# Patient Record
Sex: Female | Born: 1985
Health system: Southern US, Community
[De-identification: ages and names within clinical notes are randomized; demographics above are authoritative.]

## PROBLEM LIST (undated history)

## (undated) DIAGNOSIS — F419 Anxiety disorder, unspecified: Secondary | ICD-10-CM

## (undated) DIAGNOSIS — I10 Essential (primary) hypertension: Secondary | ICD-10-CM

## (undated) DIAGNOSIS — Z789 Other specified health status: Secondary | ICD-10-CM

## (undated) HISTORY — PX: CARPAL TUNNEL RELEASE: SHX101

---
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
## 2010-12-21 ENCOUNTER — Other Ambulatory Visit: Payer: Self-pay | Admitting: Women's Health

## 2010-12-21 ENCOUNTER — Other Ambulatory Visit (HOSPITAL_COMMUNITY)
Admission: RE | Admit: 2010-12-21 | Discharge: 2010-12-21 | Disposition: A | Discharge status: Home / Self Care | Payer: BC Managed Care – PPO | Source: Ambulatory Visit | Attending: Gynecology | Admitting: Gynecology

## 2010-12-21 ENCOUNTER — Ambulatory Visit (INDEPENDENT_AMBULATORY_CARE_PROVIDER_SITE_OTHER): Payer: BC Managed Care – PPO | Admitting: Women's Health

## 2010-12-21 DIAGNOSIS — Z01419 Encounter for gynecological examination (general) (routine) without abnormal findings: Secondary | ICD-10-CM

## 2010-12-21 DIAGNOSIS — Z124 Encounter for screening for malignant neoplasm of cervix: Secondary | ICD-10-CM | POA: Insufficient documentation

## 2010-12-21 DIAGNOSIS — R82998 Other abnormal findings in urine: Secondary | ICD-10-CM

## 2010-12-21 DIAGNOSIS — B373 Candidiasis of vulva and vagina: Secondary | ICD-10-CM

## 2010-12-21 DIAGNOSIS — B3731 Acute candidiasis of vulva and vagina: Secondary | ICD-10-CM

## 2010-12-21 DIAGNOSIS — Z833 Family history of diabetes mellitus: Secondary | ICD-10-CM

## 2011-03-07 ENCOUNTER — Telehealth: Payer: Self-pay | Admitting: *Deleted

## 2011-03-07 NOTE — Telephone Encounter (Signed)
Telephone call to patient regarding problem with birth control pills. When seen  for her annual exam her blood pressure was elevated 154/90 and 146/90. She had been on Yasmin we did stop that she was going to schedule Mirena IUD but states does not want to do that. She has been on Micronor now for 2 months and is having periods every 1-3 weeks and would like to try combination pill again. She states she has checked her blood pressure away from the office and that's been less than 130/80. Also has changed her diet as well as her exercise. Did review we could try the low Lo estrin and recheck her blood pressure after being on that. Will schedule an office visit, will continue on the Micronor until then. Switched to scheduling. She also states she has checked a home U PT which was negative.

## 2011-03-07 NOTE — Telephone Encounter (Signed)
Patient c/o issues with her birth control pills. Periods evey other week, and other side effects she want to discuss with you.

## 2011-03-09 ENCOUNTER — Ambulatory Visit (INDEPENDENT_AMBULATORY_CARE_PROVIDER_SITE_OTHER): Payer: BC Managed Care – PPO | Admitting: Women's Health

## 2011-03-09 ENCOUNTER — Encounter: Payer: Self-pay | Admitting: Women's Health

## 2011-03-09 VITALS — BP 120/70

## 2011-03-09 DIAGNOSIS — Z309 Encounter for contraceptive management, unspecified: Secondary | ICD-10-CM

## 2011-03-09 DIAGNOSIS — N946 Dysmenorrhea, unspecified: Secondary | ICD-10-CM

## 2011-03-09 DIAGNOSIS — IMO0001 Reserved for inherently not codable concepts without codable children: Secondary | ICD-10-CM

## 2011-03-09 MED ORDER — IBUPROFEN 600 MG PO TABS: 600.0000 mg | ORAL_TABLET | Freq: Three times a day (TID) | ORAL | Status: AC | PRN

## 2011-03-09 MED ORDER — NORETHIN-ETH ESTRAD-FE BIPHAS 1 MG-10 MCG / 10 MCG PO TABS: 1.0000 | ORAL_TABLET | Freq: Every day | ORAL | Status: DC

## 2011-03-09 NOTE — Progress Notes (Signed)
  Presents today  to discuss contraception. When she was here for her annual exam 6/12 her blood pressure was elevated 154/90, 146/90, she has also had some increased pressures in the past. Did review we could no longer prescribe Yasmin safely with an elevated blood pressure. She started on Micronor, has taken  with problems of spotting, irregular cycles, and some increased menstrual cramping. She has made some lifestyle changes she has lost about 10 pounds, she is eating a lower salt  and  carb diet and states is actually feeling better also.  BP today is 120/70. Did review this is normal she states she has had blood pressures less than 130/80 consistently when she has had checked at health services at [facility]. Will try  lo Loestrin 1 by mouth daily will start today, she will continue to monitor  her blood pressure . Prescription was given for 6 months and she will check her blood pressure - did review importance  it does elevate greater than 130/80 to return to the office. Reviewed slight risk for blood clots, strokes. She states she is aware the importance of blood pressure management, her mother is hypertensive. Continue to use condoms, which she does, same partner. She is a nonsmoker.  She also does have dysmenorrhea, will use Motrin 600 every 8 hours as needed . Prescription given.

## 2011-06-01 ENCOUNTER — Encounter: Payer: Self-pay | Admitting: Women's Health

## 2011-06-01 ENCOUNTER — Ambulatory Visit (INDEPENDENT_AMBULATORY_CARE_PROVIDER_SITE_OTHER): Payer: BC Managed Care – PPO | Admitting: Women's Health

## 2011-06-01 DIAGNOSIS — N926 Irregular menstruation, unspecified: Secondary | ICD-10-CM

## 2011-06-01 NOTE — Progress Notes (Signed)
Presents with a problem of irregular cycle on Lo Loestrin 24. States often will have 2 cycles in the month, had a regular monthly cycle on Yaz, but blood pressure was 150/90 annual exam while on Yaz.  Tried Micronor for several months, had headaches, spotting, increased acne and did not feel well. States blood pressure has been 130s/ 90 or lower on the lo Loestrin. Reviewed diastolic of 90s to high. Blood pressure today is 138/90 on no contraceptive pill. Has numerous family members with hypertension. States has increased exercise, but is having difficulty losing weight. Nonsmoker. Same partner.  Plan: Contraceptive management reviewed, reviewed Mirena IUD, Depo-Provera and Nexplanon. Declines, states would prefer Yaz. Will stay off pills, followup with primary care for blood pressure management. Will return to the office to restart Yaz after blood pressure is stable and less than 130/80. Reviewed risks of blood clots, strokes with elevated blood pressure and birth control pills. Will continue condoms, (always uses) plan B emergency contraception reviewed. Will also check a TSH and prolactin

## 2011-06-04 ENCOUNTER — Telehealth: Payer: Self-pay | Admitting: *Deleted

## 2011-06-04 DIAGNOSIS — I10 Essential (primary) hypertension: Secondary | ICD-10-CM

## 2011-06-04 NOTE — Telephone Encounter (Signed)
Please have a CMET added to the labs- thank you

## 2011-06-04 NOTE — Telephone Encounter (Signed)
Harlin Heys PA over at [facility] saw Meghan Mosley and started her on a 12.5 strength Hydrochlorothiazide.  Her B/P was 130/90 and will recheck her in 2 weeks.  Wants to know if we could add a CMET to the labs drawn from Friday visit? (Checked with the lab, they can order) OK to do?

## 2011-06-05 ENCOUNTER — Other Ambulatory Visit: Payer: Self-pay | Admitting: Gynecology

## 2011-06-05 DIAGNOSIS — I10 Essential (primary) hypertension: Secondary | ICD-10-CM

## 2011-06-05 LAB — COMPREHENSIVE METABOLIC PANEL
AST: 14 U/L (ref 0–37)
Albumin: 4.4 g/dL (ref 3.5–5.2)
Alkaline Phosphatase: 75 U/L (ref 39–117)
BUN: 12 mg/dL (ref 6–23)
Potassium: 4.8 mEq/L (ref 3.5–5.3)

## 2011-06-05 NOTE — Telephone Encounter (Signed)
Order in pc for lab to be run.

## 2011-11-04 ENCOUNTER — Ambulatory Visit (INDEPENDENT_AMBULATORY_CARE_PROVIDER_SITE_OTHER): Payer: BC Managed Care – PPO | Admitting: Family Medicine

## 2011-11-04 VITALS — BP 145/87 | HR 85 | Temp 94.4°F | Resp 18 | Ht 64.0 in | Wt 171.4 lb

## 2011-11-04 DIAGNOSIS — L0501 Pilonidal cyst with abscess: Secondary | ICD-10-CM

## 2011-11-04 DIAGNOSIS — L0591 Pilonidal cyst without abscess: Secondary | ICD-10-CM

## 2011-11-04 DIAGNOSIS — R030 Elevated blood-pressure reading, without diagnosis of hypertension: Secondary | ICD-10-CM

## 2011-11-04 MED ORDER — HYDROCODONE-ACETAMINOPHEN 5-325 MG PO TABS: 1.0000 | ORAL_TABLET | Freq: Four times a day (QID) | ORAL | Status: AC | PRN

## 2011-11-04 MED ORDER — DOXYCYCLINE HYCLATE 100 MG PO TABS: 100.0000 mg | ORAL_TABLET | Freq: Two times a day (BID) | ORAL | Status: AC

## 2011-11-04 NOTE — Progress Notes (Signed)
  Subjective:    Patient ID: Meghan Mosley, female    DOB: [birth year], 26 y.o.   MRN: [id number]  HPI Patient presents with 4 day history of tender bump on her tailbone.  Progressive pain. Denies drainage.  Denies fever or chills  PMH/ elevated BP readings that patient is caring for by behavioral interventions           Tdap 3 years ago  Non smoker  Review of Systems     Objective:   Physical Exam  Constitutional: She appears well-developed.  Cardiovascular: Normal rate.   Pulmonary/Chest: Effort normal and breath sounds normal.  Musculoskeletal:       Back:          Assessment & Plan:   1. Pilonidal abscess, S/P I+D  Wound culture, doxycycline (VIBRA-TABS) 100 MG tablet, HYDROcodone-acetaminophen (NORCO) 5-325 MG per tablet  2. Elevated BP      Anticipatory guidance Follow up 48 hours for packing change

## 2011-11-04 NOTE — Progress Notes (Signed)
VCO. Local anesthesia with1cc 2% plain lidocaine.  SPandD. Incision with 11 blade. Moderate purulence expressed. Wound flushed with 4 cc lidocaine. Wound packed with plain 1/4in. Gauze packing.  Cleansed and dressed.

## 2011-11-04 NOTE — Patient Instructions (Signed)
Pilonidal Cyst A pilonidal cyst occurs when hairs get trapped (ingrown) beneath the skin in the crease between the buttocks over your sacrum (the bone under that crease). Pilonidal cysts are most common in young men with a lot of body hair. When the cyst is ruptured (breaks) or leaking, fluid from the cyst may cause burning and itching. If the cyst becomes infected, it causes a painful swelling filled with pus (abscess). The pus and trapped hairs need to be removed (often by lancing) so that the infection can heal. However, recurrence is common and an operation may be needed to remove the cyst. HOME CARE INSTRUCTIONS   If the cyst was NOT INFECTED:   Keep the area clean and dry. Bathe or shower daily. Wash the area well with a germ-killing soap. Warm tub baths may help prevent infection and help with drainage. Dry the area well with a towel.   Avoid tight clothing to keep area as moisture free as possible.   Keep area between buttocks as free of hair as possible. A depilatory may be used.   If the cyst WAS INFECTED and needed to be drained:   Your caregiver packed the wound with gauze to keep the wound open. This allows the wound to heal from the inside outwards and continue draining.   Return for a wound check in 1 day or as suggested.   If you take tub baths or showers, repack the wound with gauze following them. Sponge baths (at the sink) are a good alternative.   If an antibiotic was ordered to fight the infection, take as directed.   Only take over-the-counter or prescription medicines for pain, discomfort, or fever as directed by your caregiver.   After the drain is removed, use sitz baths for 20 minutes 4 times per day. Clean the wound gently with mild unscented soap, pat dry, and then apply a dry dressing.  SEEK MEDICAL CARE IF:   You have increased pain, swelling, redness, drainage, or bleeding from the area.   You have a fever.   You have muscles aches, dizziness, or a  general ill feeling.  Document Released: 06/29/2000 Document Revised: 06/21/2011 Document Reviewed: 08/27/2008 Exit[employer] Patient Information 2012 [employer]. 

## 2011-11-06 ENCOUNTER — Ambulatory Visit (INDEPENDENT_AMBULATORY_CARE_PROVIDER_SITE_OTHER): Payer: BC Managed Care – PPO | Admitting: Physician Assistant

## 2011-11-06 ENCOUNTER — Encounter: Payer: Self-pay | Admitting: Physician Assistant

## 2011-11-06 VITALS — BP 144/86 | HR 81 | Temp 98.5°F | Resp 16 | Ht 62.75 in | Wt 176.4 lb

## 2011-11-06 DIAGNOSIS — L0591 Pilonidal cyst without abscess: Secondary | ICD-10-CM

## 2011-11-06 DIAGNOSIS — L0501 Pilonidal cyst with abscess: Secondary | ICD-10-CM

## 2011-11-06 NOTE — Progress Notes (Signed)
   Patient ID: Danaysia Rader MRN: [id number], DOB: [birth year] 26 y.o. Date of Encounter: 11/06/2011, 3:26 PM  Primary Physician: No primary provider on file.  Chief Complaint: Wound care   See previous note  HPI: 26 y.o. y/o female presents for wound care s/p I&D on 11/04/11. Doing well No issues or complaints Afebrile/ no chills No nausea or vomiting Tolerating Doxycycline Pain improved. Pain currently 1-2/10 without requiring medication Daily dressing change Previous note reviewed  No past medical history on file.   Home Meds: Prior to Admission medications   Medication Sig Start Date End Date Taking? Authorizing Provider  doxycycline (VIBRA-TABS) 100 MG tablet Take 1 tablet (100 mg total) by mouth 2 (two) times daily. 11/04/11 11/14/11 Yes Dois Davenport, MD  HYDROcodone-acetaminophen (NORCO) 5-325 MG per tablet Take 1 tablet by mouth every 6 (six) hours as needed for pain. 11/04/11 11/14/11 Yes Dois Davenport, MD  Multiple Vitamin (MULTIVITAMIN) capsule Take 1 capsule by mouth daily.     Yes Historical Provider, MD  Norethindrone-Ethinyl Estradiol-Fe Biphas (LO LOESTRIN FE) 1 MG-10 MCG / 10 MCG tablet Take 1 tablet by mouth daily. 03/09/11 03/08/12  Harrington Challenger, NP    Allergies: No Known Allergies  ROS: Constitutional: Afebrile, no chills Cardiovascular: negative for chest pain or palpitations Dermatological: Positive for wound. Negative for warmth.  GI: No nausea or vomiting   EXAM: Physical Exam: Blood pressure 144/86, pulse 81, temperature 98.5 F (36.9 C), temperature source Oral, resp. rate 16, height 5' 2.75" (1.594 m), weight 176 lb 6.4 oz (80.015 kg)., Body mass index is 31.50 kg/(m^2). General: Well developed, well nourished, in no acute distress. Nontoxic appearing. Head: Normocephalic, atraumatic, sclera non-icteric.  Neck: Supple. Lungs: Breathing is unlabored. Heart: Normal rate. Skin:  Warm and moist. Dressing and packing in place. Mild local induration,  erythema, and tenderness to palpation. Neuro: Alert and oriented X 3. Moves all extremities spontaneously. Normal gait.  Psych:  Responds to questions appropriately with a normal affect.   PROCEDURE: Dressing and packing removed. Scant purulence expressed Wound bed healthy Irrigated with 1% plain lidocaine 5 cc. Repacked with 1/4 inch plain packing Dressing applied  LAB: Culture: Preliminary results moderate strep species   A/P: 26 y.o. y/o female with cellulitis/abscess as above s/p I&D on 11/04/11 -Wound care per above -Continue Doxycycline -Pain well controlled -Daily dressing changes -Recheck 48 hours  Signed, Eula Listen, PA-C 11/06/2011 3:26 PM

## 2011-11-07 LAB — WOUND CULTURE: Gram Stain: NONE SEEN

## 2011-11-08 ENCOUNTER — Ambulatory Visit: Payer: BC Managed Care – PPO | Admitting: Internal Medicine

## 2011-11-08 ENCOUNTER — Encounter: Payer: Self-pay | Admitting: Family Medicine

## 2011-11-08 VITALS — BP 133/80 | HR 84 | Temp 98.5°F | Resp 16 | Ht 62.0 in | Wt 175.0 lb

## 2011-11-08 DIAGNOSIS — L0501 Pilonidal cyst with abscess: Secondary | ICD-10-CM

## 2011-11-08 NOTE — Progress Notes (Signed)
  Subjective:    Patient ID: Meghan Mosley, female    DOB: [birth year], 26 y.o.   MRN: [id number]  HPI  Meghan Mosley is here for wound care of her pilonidal cyst.  See previous notes.  She is taking her doxycycline without difficulty.  No fever, chills. Not taking pain medication any longer.  Complains of itching over the wound.    Review of Systems  All other systems reviewed and are negative.  negative except as noted in HPI     Objective:   Physical Exam  Vitals reviewed. Psychiatric: She has a normal mood and affect. Her behavior is normal.   Procedure Note:  VCO.  Dressing and packing removed which showed bloody drainage.  5cc 1% lidocaine used to irrigate wound, no purulence expressed.  Wound bed red, healthy appearing.  Repacked with 1/4 inch packing, telfa, gauze, hypofix.       Assessment & Plan:  Pilonidal abcess improving:  RTC in 2-3 days, fast pass given.  Wound care reviewed.  Culture showed streptococci, will have her finish doxycycline as healing well.

## 2011-11-10 ENCOUNTER — Ambulatory Visit (INDEPENDENT_AMBULATORY_CARE_PROVIDER_SITE_OTHER): Payer: BC Managed Care – PPO | Admitting: Physician Assistant

## 2011-11-10 VITALS — BP 130/79 | HR 87 | Temp 98.5°F | Resp 16 | Ht 63.25 in | Wt 176.0 lb

## 2011-11-10 DIAGNOSIS — L0501 Pilonidal cyst with abscess: Secondary | ICD-10-CM

## 2011-11-10 NOTE — Progress Notes (Signed)
   Patient ID: Petrice Beedy MRN: [id number], DOB: 1985/11/29 26 y.o. Date of Encounter: 11/10/2011, 12:09 PM  Primary Physician: Tally Due, MD, MD  Chief Complaint: Wound care   See previous note  HPI: 26 y.o. y/o female presents for wound care s/p I&D on 11/04/11. Doing well No issues or complaints Afebrile/ no chills No nausea or vomiting Tolerating Doxycycline Pain resolved Packing came out the previous day Daily dressing change Previous note reviewed  No past medical history on file.   Home Meds: Prior to Admission medications   Medication Sig Start Date End Date Taking? Authorizing Provider  doxycycline (VIBRA-TABS) 100 MG tablet Take 1 tablet (100 mg total) by mouth 2 (two) times daily. 11/04/11 11/14/11 Yes Dois Davenport, MD  Multiple Vitamin (MULTIVITAMIN) capsule Take 1 capsule by mouth daily.     Yes Historical Provider, MD  HYDROcodone-acetaminophen (NORCO) 5-325 MG per tablet Take 1 tablet by mouth every 6 (six) hours as needed for pain. 11/04/11 11/14/11  Dois Davenport, MD    Allergies: No Known Allergies  ROS: Constitutional: Afebrile, no chills Cardiovascular: negative for chest pain or palpitations Dermatological: Positive for wound. Negative for erythema, pain, or warmth  GI: No nausea or vomiting   EXAM: Physical Exam: Blood pressure 130/79, pulse 87, temperature 98.5 F (36.9 C), temperature source Oral, resp. rate 16, height 5' 3.25" (1.607 m), weight 176 lb (79.833 kg), last menstrual period 11/04/2011., Body mass index is 30.93 kg/(m^2). General: Well developed, well nourished, in no acute distress. Nontoxic appearing. Head: Normocephalic, atraumatic, sclera non-icteric.  Neck: Supple. Lungs: Breathing is unlabored. Heart: Normal rate. Skin:  Warm and moist. Dressing and packing in place. No induration, erythema, or tenderness to palpation. Neuro: Alert and oriented X 3. Moves all extremities spontaneously. Normal gait.  Psych:  Responds  to questions appropriately with a normal affect.   PROCEDURE: Dressing and packing removed. No purulence expressed Wound bed healthy and healed up the skin surface Irrigated with 1% plain lidocaine 5 cc. No packing required Dressing applied  LAB: Culture: strep  A/P: 26 y.o. y/o female with resolved cellulitis/abscess as above s/p I&D on 11/04/11 -Wound care per above -Resolved -Continue Doxycyline until complete -No pain -Daily dressing changes  Signed, Eula Listen, PA-C 11/10/2011 12:09 PM

## 2012-01-01 ENCOUNTER — Ambulatory Visit (INDEPENDENT_AMBULATORY_CARE_PROVIDER_SITE_OTHER): Payer: BC Managed Care – PPO | Admitting: Internal Medicine

## 2012-01-01 ENCOUNTER — Ambulatory Visit: Payer: BC Managed Care – PPO

## 2012-01-01 VITALS — BP 148/81 | HR 73 | Temp 97.7°F | Resp 16 | Ht 64.0 in | Wt 177.0 lb

## 2012-01-01 DIAGNOSIS — R209 Unspecified disturbances of skin sensation: Secondary | ICD-10-CM

## 2012-01-01 DIAGNOSIS — N926 Irregular menstruation, unspecified: Secondary | ICD-10-CM

## 2012-01-01 DIAGNOSIS — M542 Cervicalgia: Secondary | ICD-10-CM

## 2012-01-01 DIAGNOSIS — R202 Paresthesia of skin: Secondary | ICD-10-CM

## 2012-01-01 DIAGNOSIS — N912 Amenorrhea, unspecified: Secondary | ICD-10-CM

## 2012-01-01 DIAGNOSIS — E282 Polycystic ovarian syndrome: Secondary | ICD-10-CM

## 2012-01-01 LAB — COMPREHENSIVE METABOLIC PANEL
AST: 19 U/L (ref 0–37)
Alkaline Phosphatase: 88 U/L (ref 39–117)
BUN: 12 mg/dL (ref 6–23)
Calcium: 9.6 mg/dL (ref 8.4–10.5)
Creat: 0.76 mg/dL (ref 0.50–1.10)

## 2012-01-01 LAB — POCT CBC
Lymph, poc: 2 (ref 0.6–3.4)
MCH, POC: 27.6 pg (ref 27–31.2)
MCHC: 31.9 g/dL (ref 31.8–35.4)
MID (cbc): 0.4 (ref 0–0.9)
MPV: 10.1 fL (ref 0–99.8)
POC MID %: 5.8 %M (ref 0–12)
Platelet Count, POC: 285 10*3/uL (ref 142–424)
WBC: 6.7 10*3/uL (ref 4.6–10.2)

## 2012-01-01 MED ORDER — DROSPIRENONE-ETHINYL ESTRADIOL 3-0.02 MG PO TABS: 1.0000 | ORAL_TABLET | Freq: Every day | ORAL | Status: DC

## 2012-01-01 NOTE — Progress Notes (Signed)
Subjective:    Patient ID: Meghan Mosley, female    DOB: 1986-05-01, 26 y.o.   MRN: [id number]  HPIProblem #1 right hand, specifically the thumb index and middle fingers will go while playing the clarinet or while holding things, while text messaging, and she has to shake her hand for it to resolve. Getting worse over 6 months. Occasionally wakes at night with hand numb. This occasionally happens in the left hand as well but it's not as severe. It doesn't happen as much playing saxophone but does happen when she brushes her teeth holding electric toothbrush. No weakness. Hand is not cold or pale. She has occasional stiffness and tenderness in the left cervical area but no history of prior neck problems. 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  Past medical history-PCOS-usually controlled with oral contraceptives which she also needs her birth control but her gynecologist stopped these in October 2012 because her blood pressure was elevated. She has a family history of hypertension by her home blood pressures are always in the range of 120 systolic. She is only had 3 periods since discontinuing the oral contraceptives and wants a pregnancy test today. Off of birth control pills she has resumed with hirsutism as well.  Review of Systems  Constitutional: Negative for fever, activity change, appetite change, fatigue and unexpected weight change.  Eyes: Negative.   Respiratory: Negative.   Cardiovascular: Negative.   Gastrointestinal: Negative.   Genitourinary: Negative.   Neurological: Negative for dizziness and headaches.       Objective:   Physical Exam Blood pressure 148/81 Weight 277 pounds HEENT clear including no thyromegaly Neck has good range of motion with mild tenderness on extension She is tender in the right paracervical muscles and in the left trapezius The right shoulder has a full range of motion without pain There are no  sensory or motor losses in the right arm or hand and grip is full Radial pulses full and does not vary with position of the arm at any degree of elevation There no supraclavicular bruits/no supraclavicular nodes The left hand and shoulder are normal to exam Tinel's is negative bilaterally Extension of the wrist does not recreate numbness over 30-45 seconds        Results for orders placed in visit on 01/01/12  POCT URINE PREGNANCY      Component Value Range   Preg Test, Ur Negative    POCT CBC      Component Value Range   WBC 6.7  4.6 - 10.2 K/uL   Lymph, poc 2.0  0.6 - 3.4   POC LYMPH PERCENT 30.4  10 - 50 %L   MID (cbc) 0.4  0 - 0.9   POC MID % 5.8  0 - 12 %M   POC Granulocyte 4.3  2 - 6.9   Granulocyte percent 63.8  37 - 80 %G   RBC 5.15  4.04 - 5.48 M/uL   Hemoglobin 14.2  12.2 - 16.2 g/dL   HCT, POC 40.9  81.1 - 47.9 %   MCV 86.5  80 - 97 fL   MCH, POC 27.6  27 - 31.2 pg   MCHC 31.9  31.8 - 35.4 g/dL   RDW, POC 91.4     Platelet Count, POC 285  142 - 424 K/uL   MPV 10.1  0 - 99.8 fL   [facility] reading (PRIMARY) by  Dr.Keilen Kahl=Loss of normal lordosis but otherwise C-spine is within  normal limits    Assessment & Plan:  Problem #1 bilateral hand paresthesias with right greater than left Problem #2 PCO S. With amenorrhea  problem #3Neck pain Problem #4 elevated blood pressure without diagnosis of hypertension Problem #5 overweight  Check metabolic profile and TSH She will will need neurological consultation, MRI of the C-spine, or peripheral nerve conduction velocities and we will decide this after her lab work I recommend restarting Yaz and then following home bp

## 2012-01-05 NOTE — Addendum Note (Signed)
Addended by: Johnnette Litter on: 01/05/2012 12:47 PM   Modules accepted: Orders

## 2012-01-24 ENCOUNTER — Other Ambulatory Visit: Payer: Self-pay

## 2012-01-24 DIAGNOSIS — E282 Polycystic ovarian syndrome: Secondary | ICD-10-CM

## 2012-06-30 ENCOUNTER — Ambulatory Visit (INDEPENDENT_AMBULATORY_CARE_PROVIDER_SITE_OTHER): Payer: BC Managed Care – PPO | Admitting: Internal Medicine

## 2012-06-30 VITALS — BP 132/80 | HR 83 | Temp 98.1°F | Resp 18 | Ht 63.5 in | Wt 179.6 lb

## 2012-06-30 DIAGNOSIS — M549 Dorsalgia, unspecified: Secondary | ICD-10-CM

## 2012-06-30 DIAGNOSIS — S335XXA Sprain of ligaments of lumbar spine, initial encounter: Secondary | ICD-10-CM

## 2012-06-30 MED ORDER — MELOXICAM 15 MG PO TABS: 15.0000 mg | ORAL_TABLET | Freq: Every day | ORAL | Status: DC

## 2012-06-30 MED ORDER — CYCLOBENZAPRINE HCL 10 MG PO TABS: 10.0000 mg | ORAL_TABLET | Freq: Every day | ORAL | Status: DC

## 2012-06-30 NOTE — Progress Notes (Signed)
  Subjective:    Patient ID: Meghan Mosley, female    DOB: 1986-03-13, 26 y.o.   MRN: [id number]  HPI3d ago twisting and lifting jerk at work Pain ever since in low back--can't bend No radicular pain to the legs No weakness in the legs/no paresthesias No bowel or bladder changes No past history of back problems Works serving at [employer]    Review of Systems No intercurrent illness complaints    Objective:   Physical Exam Vital signs stable except obesity Spine is straight There's tenderness to palpation from the left lower rib margin posteriorly to the iliac crest along the entire left lumbosacral area/straight leg raise bilaterally to 90 creates discomfort along the left side without radicular symptoms Twisting and flexion increased pain Deep tendon reflexes are symmetrical There is no peripheral numbness or weakness Hip range of motion is good       Assessment & Plan:  Problem #1 acute lumbosacral strain-left  Meds ordered this encounter  Medications  . cyclobenzaprine (FLEXERIL) 10 MG tablet    Sig: Take 1 tablet (10 mg total) by mouth at bedtime.    Dispense:  30 tablet    Refill:  0  . meloxicam (MOBIC) 15 MG tablet    Sig: Take 1 tablet (15 mg total) by mouth daily.    Dispense:  30 tablet    Refill:  0   Back exercises given Use heat Restrict work 7-10 days Followup 7-10 days if not well

## 2012-06-30 NOTE — Patient Instructions (Addendum)
Back Exercises Back exercises help treat and prevent back injuries. The goal is to increase your strength in your belly (abdominal) and back muscles. These exercises can also help with flexibility. Start these exercises when told by your doctor. HOME CARE Back exercises include: Pelvic Tilt.  Lie on your back with your knees bent. Tilt your pelvis until the lower part of your back is against the floor. Hold this position 5 to 10 sec. Repeat this exercise 5 to 10 times. Knee to Chest.  Pull 1 knee up against your chest and hold for 20 to 30 seconds. Repeat this with the other knee. This may be done with the other leg straight or bent, whichever feels better. Then, pull both knees up against your chest. Sit-Ups or Curl-Ups.  Bend your knees 90 degrees. Start with tilting your pelvis, and do a partial, slow sit-up. Only lift your upper half 30 to 45 degrees off the floor. Take at least 2 to 3 seonds for each sit-up. Do not do sit-ups with your knees out straight. If partial sit-ups are difficult, simply do the above but with only tightening your belly (abdominal) muscles and holding it as told. Hip-Lift.  Lie on your back with your knees flexed 90 degrees. Push down with your feet and shoulders as you raise your hips 2 inches off the floor. Hold for 10 seconds, repeat 5 to 10 times. Back Arches.  Lie on your stomach. Prop yourself up on bent elbows. Slowly press on your hands, causing an arch in your low back. Repeat 3 to 5 times. Shoulder-Lifts.  Lie face down with arms beside your body. Keep hips and belly pressed to floor as you slowly lift your head and shoulders off the floor. Do not overdo your exercises. Be careful in the beginning. Exercises may cause you some mild back discomfort. If the pain lasts for more than 15 minutes, stop the exercises until you see your doctor. Improvement with exercise for back problems is slow.  Document Released: 08/04/2010 Document Revised: 09/24/2011  Document Reviewed: 05/03/2011 Exit[employer] Patient Information 2013 [employer].  

## 2012-09-18 ENCOUNTER — Ambulatory Visit (INDEPENDENT_AMBULATORY_CARE_PROVIDER_SITE_OTHER): Payer: BC Managed Care – PPO | Admitting: Emergency Medicine

## 2012-09-18 VITALS — BP 132/76 | HR 91 | Temp 98.0°F | Resp 17 | Ht 63.5 in | Wt 180.0 lb

## 2012-09-18 DIAGNOSIS — H9209 Otalgia, unspecified ear: Secondary | ICD-10-CM

## 2012-09-18 DIAGNOSIS — J020 Streptococcal pharyngitis: Secondary | ICD-10-CM

## 2012-09-18 DIAGNOSIS — J329 Chronic sinusitis, unspecified: Secondary | ICD-10-CM

## 2012-09-18 LAB — POCT RAPID STREP A (OFFICE): Rapid Strep A Screen: NEGATIVE

## 2012-09-18 MED ORDER — FLUTICASONE PROPIONATE 50 MCG/ACT NA SUSP: 2.0000 | Freq: Every day | NASAL | Status: DC

## 2012-09-18 MED ORDER — AMOXICILLIN 875 MG PO TABS: 875.0000 mg | ORAL_TABLET | Freq: Two times a day (BID) | ORAL | Status: DC

## 2012-09-18 NOTE — Progress Notes (Signed)
  Subjective:    Patient ID: Meghan Mosley, female    DOB: Jan 29, 1986, 27 y.o.   MRN: [id number]  HPI Pt presents with sore throat and cough that started last Tuesday. It progressed with left ear pain and nasal congestion.  She has been taking otc meds but its not helping at all. Throat is now swollen and it is hard to swallow. No aches or fever.  She has been taking Catering manager and Mucinex.     Review of Systems patient is on birth control pills     Objective:   Physical Exam injured clear. Nose is congested. Throat is red on the left. There are no anterior cervical nodes. Chest is clear to auscultation and percussion  Results for orders placed in visit on 09/18/12  POCT RAPID STREP A (OFFICE)      Result Value Range   Rapid Strep A Screen Negative  Negative        Assessment & Plan:  Patient here with a sinusitis. We'll treat with amoxicillin and Flonase. Strep test done was negative

## 2013-01-23 ENCOUNTER — Other Ambulatory Visit: Payer: Self-pay | Admitting: Internal Medicine

## 2013-05-18 ENCOUNTER — Ambulatory Visit (INDEPENDENT_AMBULATORY_CARE_PROVIDER_SITE_OTHER): Payer: BC Managed Care – PPO | Admitting: Physician Assistant

## 2013-05-18 VITALS — BP 130/80 | HR 70 | Temp 98.7°F | Resp 18 | Wt 185.0 lb

## 2013-05-18 DIAGNOSIS — H8110 Benign paroxysmal vertigo, unspecified ear: Secondary | ICD-10-CM

## 2013-05-18 DIAGNOSIS — Z3009 Encounter for other general counseling and advice on contraception: Secondary | ICD-10-CM

## 2013-05-18 DIAGNOSIS — Z30011 Encounter for initial prescription of contraceptive pills: Secondary | ICD-10-CM

## 2013-05-18 MED ORDER — MECLIZINE HCL 25 MG PO TABS: 25.0000 mg | ORAL_TABLET | Freq: Three times a day (TID) | ORAL | Status: DC | PRN

## 2013-05-18 MED ORDER — DROSPIRENONE-ETHINYL ESTRADIOL 3-0.02 MG PO TABS: 1.0000 | ORAL_TABLET | Freq: Every day | ORAL | Status: DC

## 2013-05-18 NOTE — Progress Notes (Signed)
   [address] [place] [address]   Phone [phone]  Subjective:    Patient ID: Meghan Mosley, female    DOB: May 02, 1986, 27 y.o.   MRN: [id number]  HPI Pt presents to clinic with vertigo whenever she turns her head to the right and looks up.  She has similar sensation when she goes from sitting to laying.  When she is laying completely still she has no symptoms.  It started yesterday and she worked through her symptoms.  Today she had off and she thought resting would make it better but it has not.  She has had a headache today but she has had no caffeine and this feels like one of those headaches.  She has had no recent colds.  She is not weak.  She is having no vision changes.  Review of Systems  Constitutional: Negative for fever and chills.  HENT: Negative for congestion, ear discharge, ear pain and hearing loss.   Gastrointestinal: Negative for nausea.  Neurological: Positive for dizziness and headaches. Negative for weakness.       Objective:   Physical Exam  Vitals reviewed. Constitutional: She is oriented to person, place, and time. She appears well-developed and well-nourished.  HENT:  Head: Normocephalic and atraumatic.  Right Ear: Hearing, external ear and ear canal normal. A middle ear effusion is present.  Left Ear: Hearing, external ear and ear canal normal. A middle ear effusion is present.  Nose: Nose normal.  Mouth/Throat: Oropharynx is clear and moist.  Eyes: Conjunctivae and lids are normal. Pupils are equal, round, and reactive to light. Right eye exhibits nystagmus (horizontal). Left eye exhibits nystagmus (horizontal). Right pupil is round and reactive. Left pupil is round and reactive. Pupils are equal.  Cardiovascular: Normal rate, regular rhythm and normal heart sounds.   No murmur heard. Pulmonary/Chest: Effort normal and breath sounds normal.  Neurological: She is alert and oriented to person, place, and time.  Skin: Skin is warm and dry.    Psychiatric: She has a normal mood and affect. Her behavior is normal. Judgment and thought content normal.   Epley maneuvers performed and patients symptoms resolved before she left the office.     Assessment & Plan:  BCP (birth control pills) initiation -Pt ran out of her birth control about 6 months ago and has had trouble getting it refilled. Will refill and she will make an appt for a CPE.  Plan: drospirenone-ethinyl estradiol (LORYNA) 3-0.02 MG tablet  BPV (benign positional vertigo) - Sent to the pharmacy incase her symptoms return but do not expect them to.  She will be careful with quick movements over the next little bit.  Plan: meclizine (ANTIVERT) 25 MG tablet  Warda Mcqueary [facility] 05/18/2013 7:12 PM

## 2013-05-18 NOTE — Patient Instructions (Signed)
Epley maneuver - what you can do to make the stone drop out of place.  Benign Positional Vertigo Vertigo means you feel like you or your surroundings are moving when they are not. Benign positional vertigo is the most common form of vertigo. Benign means that the cause of your condition is not serious. Benign positional vertigo is more common in older adults. CAUSES  Benign positional vertigo is the result of an upset in the labyrinth system. This is an area in the middle ear that helps control your balance. This may be caused by a viral infection, head injury, or repetitive motion. However, often no specific cause is found. SYMPTOMS  Symptoms of benign positional vertigo occur when you move your head or eyes in different directions. Some of the symptoms may include:  Loss of balance and falls.  Vomiting.  Blurred vision.  Dizziness.  Nausea.  Involuntary eye movements (nystagmus). DIAGNOSIS  Benign positional vertigo is usually diagnosed by physical exam. If the specific cause of your benign positional vertigo is unknown, your caregiver may perform imaging tests, such as magnetic resonance imaging (MRI) or computed tomography (CT). TREATMENT  Your caregiver may recommend movements or procedures to correct the benign positional vertigo. Medicines such as meclizine, benzodiazepines, and medicines for nausea may be used to treat your symptoms. In rare cases, if your symptoms are caused by certain conditions that affect the inner ear, you may need surgery. HOME CARE INSTRUCTIONS   Follow your caregiver's instructions.  Move slowly. Do not make sudden body or head movements.  Avoid driving.  Avoid operating heavy machinery.  Avoid performing any tasks that would be dangerous to you or others during a vertigo episode.  Drink enough fluids to keep your urine clear or pale yellow. SEEK IMMEDIATE MEDICAL CARE IF:   You develop problems with walking, weakness, numbness, or using your  arms, hands, or legs.  You have difficulty speaking.  You develop severe headaches.  Your nausea or vomiting continues or gets worse.  You develop visual changes.  Your family or friends notice any behavioral changes.  Your condition gets worse.  You have a fever.  You develop a stiff neck or sensitivity to light. MAKE SURE YOU:   Understand these instructions.  Will watch your condition.  Will get help right away if you are not doing well or get worse. Document Released: 04/09/2006 Document Revised: 09/24/2011 Document Reviewed: 03/22/2011 [facility] Patient Information 2014 [place], [place].

## 2013-05-22 NOTE — Progress Notes (Signed)
Left message for patient regarding scheduling.

## 2013-06-05 NOTE — Progress Notes (Signed)
Left message to schedule appointment

## 2013-09-29 ENCOUNTER — Encounter (HOSPITAL_COMMUNITY): Payer: Self-pay | Admitting: Emergency Medicine

## 2013-09-29 ENCOUNTER — Emergency Department (HOSPITAL_COMMUNITY)
Admission: EM | Admit: 2013-09-29 | Discharge: 2013-09-29 | Disposition: A | Discharge status: Home / Self Care | Payer: BC Managed Care – PPO | Attending: Emergency Medicine | Admitting: Emergency Medicine

## 2013-09-29 DIAGNOSIS — R197 Diarrhea, unspecified: Secondary | ICD-10-CM | POA: Insufficient documentation

## 2013-09-29 DIAGNOSIS — R6883 Chills (without fever): Secondary | ICD-10-CM | POA: Insufficient documentation

## 2013-09-29 DIAGNOSIS — Z79899 Other long term (current) drug therapy: Secondary | ICD-10-CM | POA: Insufficient documentation

## 2013-09-29 DIAGNOSIS — Z3202 Encounter for pregnancy test, result negative: Secondary | ICD-10-CM | POA: Insufficient documentation

## 2013-09-29 DIAGNOSIS — R1013 Epigastric pain: Secondary | ICD-10-CM | POA: Insufficient documentation

## 2013-09-29 DIAGNOSIS — R112 Nausea with vomiting, unspecified: Secondary | ICD-10-CM

## 2013-09-29 LAB — COMPREHENSIVE METABOLIC PANEL
ALK PHOS: 85 U/L (ref 39–117)
ALT: 12 U/L (ref 0–35)
AST: 17 U/L (ref 0–37)
Albumin: 4.5 g/dL (ref 3.5–5.2)
BILIRUBIN TOTAL: 0.4 mg/dL (ref 0.3–1.2)
BUN: 15 mg/dL (ref 6–23)
CHLORIDE: 96 meq/L (ref 96–112)
CO2: 18 meq/L — AB (ref 19–32)
Calcium: 10.5 mg/dL (ref 8.4–10.5)
Creatinine, Ser: 0.92 mg/dL (ref 0.50–1.10)
GFR calc Af Amer: >90 mL/min (ref >90)
GFR calc non Af Amer: 86 mL/min — ABNORMAL LOW (ref >90)
Glucose, Bld: 172 mg/dL — ABNORMAL HIGH (ref 70–99)
Potassium: 4.3 mEq/L (ref 3.7–5.3)
Sodium: 139 mEq/L (ref 137–147)
Total Protein: 8.8 g/dL — ABNORMAL HIGH (ref 6.0–8.3)

## 2013-09-29 LAB — CBC WITH DIFFERENTIAL/PLATELET
BASOS ABS: 0 10*3/uL (ref 0.0–0.1)
Basophils Relative: 0 % (ref 0–1)
Eosinophils Absolute: 0 10*3/uL (ref 0.0–0.7)
Eosinophils Relative: 0 % (ref 0–5)
HEMATOCRIT: 47.1 % — AB (ref 36.0–46.0)
Hemoglobin: 15.6 g/dL — ABNORMAL HIGH (ref 12.0–15.0)
LYMPHS PCT: 3 % — AB (ref 12–46)
Lymphs Abs: 0.5 10*3/uL — ABNORMAL LOW (ref 0.7–4.0)
MCH: 28.3 pg (ref 26.0–34.0)
MCHC: 33.1 g/dL (ref 30.0–36.0)
MCV: 85.3 fL (ref 78.0–100.0)
MONOS PCT: 7 % (ref 3–12)
Monocytes Absolute: 1.3 10*3/uL — ABNORMAL HIGH (ref 0.1–1.0)
Neutro Abs: 17.6 10*3/uL — ABNORMAL HIGH (ref 1.7–7.7)
Neutrophils Relative %: 91 % — ABNORMAL HIGH (ref 43–77)
Platelets: 311 10*3/uL (ref 150–400)
RBC: 5.52 MIL/uL — ABNORMAL HIGH (ref 3.87–5.11)
RDW: 13.2 % (ref 11.5–15.5)
WBC: 19.3 10*3/uL — ABNORMAL HIGH (ref 4.0–10.5)

## 2013-09-29 LAB — URINE MICROSCOPIC-ADD ON

## 2013-09-29 LAB — URINALYSIS, ROUTINE W REFLEX MICROSCOPIC
GLUCOSE, UA: NEGATIVE mg/dL
KETONES UR: NEGATIVE mg/dL
Nitrite: NEGATIVE
PROTEIN: 100 mg/dL — AB
Specific Gravity, Urine: 1.038 — ABNORMAL HIGH (ref 1.005–1.030)
Urobilinogen, UA: 0.2 mg/dL (ref 0.0–1.0)
pH: 6 (ref 5.0–8.0)

## 2013-09-29 LAB — LIPASE, BLOOD: Lipase: 26 U/L (ref 11–59)

## 2013-09-29 LAB — LACTIC ACID, PLASMA: Lactic Acid, Venous: 4.4 mmol/L — ABNORMAL HIGH (ref 0.5–2.2)

## 2013-09-29 LAB — PREGNANCY, URINE: PREG TEST UR: NEGATIVE

## 2013-09-29 LAB — I-STAT CG4 LACTIC ACID, ED: LACTIC ACID, VENOUS: 3.85 mmol/L — AB (ref 0.5–2.2)

## 2013-09-29 LAB — CK: CK TOTAL: 89 U/L (ref 7–177)

## 2013-09-29 MED ORDER — MORPHINE SULFATE 4 MG/ML IJ SOLN
4.0000 mg | Freq: Once | INTRAMUSCULAR | Status: AC
  Administered 2013-09-29: 4 mg via INTRAVENOUS
  Filled 2013-09-29: qty 1

## 2013-09-29 MED ORDER — SODIUM CHLORIDE 0.9 % IV SOLN
1000.0000 mL | Freq: Once | INTRAVENOUS | Status: AC
  Administered 2013-09-29: 1000 mL via INTRAVENOUS

## 2013-09-29 MED ORDER — SODIUM CHLORIDE 0.9 % IV BOLUS (SEPSIS)
1000.0000 mL | Freq: Once | INTRAVENOUS | Status: AC
  Administered 2013-09-29: 1000 mL via INTRAVENOUS

## 2013-09-29 MED ORDER — ONDANSETRON 4 MG PO TBDP: 4.0000 mg | ORAL_TABLET | Freq: Three times a day (TID) | ORAL | Status: DC | PRN

## 2013-09-29 MED ORDER — ONDANSETRON HCL 4 MG/2ML IJ SOLN
4.0000 mg | Freq: Once | INTRAMUSCULAR | Status: AC
  Administered 2013-09-29: 4 mg via INTRAVENOUS
  Filled 2013-09-29: qty 2

## 2013-09-29 MED ORDER — FENTANYL CITRATE 0.05 MG/ML IJ SOLN
100.0000 ug | Freq: Once | INTRAMUSCULAR | Status: AC
  Administered 2013-09-29: 100 ug via INTRAVENOUS
  Filled 2013-09-29: qty 2

## 2013-09-29 NOTE — Discharge Instructions (Signed)
Please read and follow all provided instructions.  Your diagnoses today include:  1. Nausea vomiting and diarrhea     Tests performed today include:  Blood counts and electrolytes  Blood tests to check liver and kidney function  Blood tests to check pancreas function  Urine test to look for infection and pregnancy (in women)  Lactic acid level - improving  Vital signs. See below for your results today.   Medications prescribed:   Zofran (ondansetron) - for nausea and vomiting  Take any prescribed medications only as directed.  Home care instructions:   Follow any educational materials contained in this packet.   Your abdominal pain, nausea, vomiting, and diarrhea may be caused by a viral gastroenteritis also called 'stomach flu'. You should rest for the next several days. Keep drinking plenty of fluids and use the medicine for nausea as directed.    Drink clear liquids for the next 24 hours and introduce solid foods slowly after 24 hours using the b.r.a.t. diet (Bananas, Rice, Applesauce, Toast, Yogurt).    Follow-up instructions: Please follow-up with your primary care provider in the next 2 days for further evaluation of your symptoms. If you are not feeling better in 48 hours you may have a condition that is more serious and you need re-evaluation. If you do not have a primary care doctor -- see below for referral information.   Return instructions:  SEEK IMMEDIATE MEDICAL ATTENTION IF:  If you have pain that does not go away or becomes severe   A temperature above 101F develops   Repeated vomiting occurs (multiple episodes)   If you have pain that becomes localized to portions of the abdomen. The right side could possibly be appendicitis. In an adult, the left lower portion of the abdomen could be colitis or diverticulitis.   Blood is being passed in stools or vomit (bright red or black tarry stools)   You develop chest pain, difficulty breathing, dizziness or  fainting, or become confused, poorly responsive, or inconsolable (young children)  If you have any other emergent concerns regarding your health  Additional Information: Abdominal (belly) pain can be caused by many things. Your caregiver performed an examination and possibly ordered blood/urine tests and imaging (CT scan, x-rays, ultrasound). Many cases can be observed and treated at home after initial evaluation in the emergency department. Even though you are being discharged home, abdominal pain can be unpredictable. Therefore, you need a repeated exam if your pain does not resolve, returns, or worsens. Most patients with abdominal pain don't have to be admitted to the hospital or have surgery, but serious problems like appendicitis and gallbladder attacks can start out as nonspecific pain. Many abdominal conditions cannot be diagnosed in one visit, so follow-up evaluations are very important.  Your vital signs today were: BP 127/73   Pulse 96   Temp(Src) 97.5 F (36.4 C) (Oral)   Resp 17   Ht 5\' 4"  (1.626 m)   Wt 170 lb (77.111 kg)   BMI 29.17 kg/m2   SpO2 100%   LMP 09/29/2013 If your blood pressure (bp) was elevated above 135/85 this visit, please have this repeated by your doctor within one month. --------------

## 2013-09-29 NOTE — ED Provider Notes (Signed)
6:23 AM Handoff from [place] PA-C at shift change.   Pt with N/V/D, minimal epigastric pain with vomiting, no surgical history -- presents with onset of N/V/D around 8pm last night. No fever. No blood in vomit or stool. No urinary symptoms.   She has received 2L NS. Labs with leukocytosis to 19k.   On my exam: Gen NAD; Heart RRR, nml S1,S2, no m/r/g; Lungs CTAB; Abd soft, NT, no rebound or guarding; Ext 2+ pedal pulses bilaterally, no edema.  Plan: hydrate 3-4L, recheck lactate. D/c to home if improving.   9:40 AM Fluids given. Patient feels better. Lactate improving. Vitals improved. She is tolerating PO's.   Exam unchanged. Encouraged clear liquids/brat diet.   The patient was urged to return to the Emergency Department immediately with worsening of current symptoms, worsening abdominal pain, persistent vomiting, blood noted in stools, fever, or any other concerns. The patient verbalized understanding.    Renne CriglerJoshua Talajah Slimp, PA-C 09/29/13 [id number]

## 2013-09-29 NOTE — ED Provider Notes (Signed)
CSN: [id number]     Arrival date & time 09/29/13  0306 History   First MD Initiated Contact with Patient 09/29/13 0410     Chief Complaint  Patient presents with  . Emesis     (Consider location/radiation/quality/duration/timing/severity/associated sxs/prior Treatment) HPI Comments: Patient is an otherwise healthy 28 year old female presented to the emergency department for acute onset nausea, vomiting, post emesis cramping epigastric pain without radiation, diarrhea that began earlier this evening. Patient states she ate at [employer] this afternoon, did not feel well after eating there and then approximately 5 hours later developed nausea, vomiting, abdominal pain, diarrhea. She also endorses chills but denies any fever. Denies any abdominal pain currently. Last episode of emesis and diarrhea was right upon arrival. No abdominal surgical history.    History reviewed. No pertinent past medical history. Past Surgical History  Procedure Laterality Date  . Mouth surgery  2008    wisdom teeth, have re grown   Family History  Problem Relation Age of Onset  . Hypertension Mother   . Heart disease Mother   . Breast cancer Paternal Grandmother   . Hypertension Paternal Grandfather   . Hypertension Maternal Grandfather   . Heart attack Maternal Grandfather   . Diabetes Other   . Fibroids Other    History  Substance Use Topics  . Smoking status: Never Smoker   . Smokeless tobacco: Never Used  . Alcohol Use: Yes     Comment: social   OB History   Grav Para Term Preterm Abortions TAB SAB Ect Mult Living   0 0             Review of Systems  Constitutional: Positive for chills.  Gastrointestinal: Positive for nausea, vomiting, abdominal pain and diarrhea.  All other systems reviewed and are negative.      Allergies  Review of patient's allergies indicates no known allergies.  Home Medications   Current Outpatient Rx  Name  Route  Sig  Dispense  Refill  .  drospirenone-ethinyl estradiol (LORYNA) 3-0.02 MG tablet   Oral   Take 1 tablet by mouth daily.   1 Package   5    BP 114/88  Pulse 125  Temp(Src) 97.5 F (36.4 C) (Oral)  Resp 20  Ht 5\' 4"  (1.626 m)  Wt 170 lb (77.111 kg)  BMI 29.17 kg/m2  SpO2 99%  LMP 09/29/2013 Physical Exam  Nursing note and vitals reviewed. Constitutional: She is oriented to person, place, and time. She appears well-developed and well-nourished. No distress.  HENT:  Head: Normocephalic and atraumatic.  Right Ear: External ear normal.  Left Ear: External ear normal.  Nose: Nose normal.  Mouth/Throat: Uvula is midline and oropharynx is clear and moist. Mucous membranes are dry. No oropharyngeal exudate.  Eyes: Conjunctivae are normal.  Neck: Normal range of motion. Neck supple.  Cardiovascular: Normal rate, regular rhythm and normal heart sounds.   Pulmonary/Chest: Effort normal and breath sounds normal. No respiratory distress.  Abdominal: Soft. Bowel sounds are normal. She exhibits no distension. There is no tenderness. There is no rebound and no guarding.  Musculoskeletal: Normal range of motion.  Neurological: She is alert and oriented to person, place, and time.  Skin: Skin is warm and dry. She is not diaphoretic.  Psychiatric: She has a normal mood and affect.    ED Course  Procedures (including critical care time) Medications  0.9 %  sodium chloride infusion (0 mLs Intravenous Stopped 09/29/13 0447)  ondansetron (ZOFRAN) injection 4 mg (  4 mg Intravenous Given 09/29/13 0336)  fentaNYL (SUBLIMAZE) injection 100 mcg (100 mcg Intravenous Given 09/29/13 0336)  sodium chloride 0.9 % bolus 1,000 mL (1,000 mLs Intravenous New Bag/Given 09/29/13 0457)  morphine 4 MG/ML injection 4 mg (4 mg Intravenous Given 09/29/13 0501)    Labs Review Labs Reviewed  CBC WITH DIFFERENTIAL - Abnormal; Notable for the following:    WBC 19.3 (*)    RBC 5.52 (*)    Hemoglobin 15.6 (*)    HCT 47.1 (*)    Neutrophils  Relative % 91 (*)    Neutro Abs 17.6 (*)    Lymphocytes Relative 3 (*)    Lymphs Abs 0.5 (*)    Monocytes Absolute 1.3 (*)    All other components within normal limits  COMPREHENSIVE METABOLIC PANEL - Abnormal; Notable for the following:    CO2 18 (*)    Glucose, Bld 172 (*)    Total Protein 8.8 (*)    GFR calc non Af Amer 86 (*)    All other components within normal limits  LIPASE, BLOOD  URINALYSIS, ROUTINE W REFLEX MICROSCOPIC   Imaging Review No results found.   EKG Interpretation None      MDM   Final diagnoses:  None    Filed Vitals:   09/29/13 0310  BP: 114/88  Pulse: 125  Temp: 97.5 F (36.4 C)  Resp: 20    Afebrile, NAD, non-toxic appearing, AAOx4. Abdomen soft, non-tender, non-distended, no guarding rigidity or rebound. Mucous membranes dry. I have reviewed nursing notes, vital signs, and all appropriate lab and imaging results for this patient. Leukocytosis noted. Lactate elevated, likely d/t emesis and diarrhea. CK ordered and pending. Will plan to rehydrate patient, recheck to ensure lactate improving. Signed out to [employer] CompaniesJoshua Scott Geiple, [place]PA-C. Patient d/w with Dr. Fonnie JarvisBednar, agrees with plan.         Jeannetta EllisJennifer L Aislynn Cifelli, PA-C 09/29/13 [phone]

## 2013-09-29 NOTE — ED Notes (Signed)
Bed: [facility] Expected date:  Expected time:  Means of arrival:  Comments: 

## 2013-09-29 NOTE — ED Notes (Signed)
Rhea BleacherJosh Geiple made aware of lactic acid results.

## 2013-09-29 NOTE — ED Notes (Signed)
Pt started vomiting about 630pm

## 2013-10-02 NOTE — ED Provider Notes (Signed)
Medical screening examination/treatment/procedure(s) were performed by non-physician practitioner and as supervising physician I was immediately available for consultation/collaboration.   EKG Interpretation None       Hurman HornJohn M Lanell Carpenter, MD 10/02/13 1031

## 2013-10-02 NOTE — ED Provider Notes (Signed)
Medical screening examination/treatment/procedure(s) were performed by non-physician practitioner and as supervising physician I was immediately available for consultation/collaboration.   EKG Interpretation None       Christain Niznik M Kerryann Allaire, MD 10/02/13 1031 

## 2013-12-04 ENCOUNTER — Other Ambulatory Visit: Payer: Self-pay | Admitting: Physician Assistant

## 2013-12-29 ENCOUNTER — Ambulatory Visit (INDEPENDENT_AMBULATORY_CARE_PROVIDER_SITE_OTHER): Payer: BC Managed Care – PPO | Admitting: Physician Assistant

## 2013-12-29 VITALS — BP 142/80 | HR 117 | Temp 99.3°F | Resp 20 | Ht 64.0 in | Wt 169.0 lb

## 2013-12-29 DIAGNOSIS — R109 Unspecified abdominal pain: Secondary | ICD-10-CM

## 2013-12-29 DIAGNOSIS — R509 Fever, unspecified: Secondary | ICD-10-CM

## 2013-12-29 DIAGNOSIS — N10 Acute tubulo-interstitial nephritis: Secondary | ICD-10-CM

## 2013-12-29 LAB — POCT CBC
Granulocyte percent: 84.7 %G — AB (ref 37–80)
HCT, POC: 41.6 % (ref 37.7–47.9)
Hemoglobin: 13.4 g/dL (ref 12.2–16.2)
Lymph, poc: 1.3 (ref 0.6–3.4)
MCH, POC: 28.2 pg (ref 27–31.2)
MCHC: 32.2 g/dL (ref 31.8–35.4)
MCV: 87.6 fL (ref 80–97)
MID (cbc): 0.6 (ref 0–0.9)
MPV: 10.1 fL (ref 0–99.8)
POC Granulocyte: 10.4 — AB (ref 2–6.9)
POC LYMPH PERCENT: 10.4 %L (ref 10–50)
POC MID %: 4.9 %M (ref 0–12)
Platelet Count, POC: 271 10*3/uL (ref 142–424)
RBC: 4.75 M/uL (ref 4.04–5.48)
RDW, POC: 14.4 %
WBC: 12.3 10*3/uL — AB (ref 4.6–10.2)

## 2013-12-29 LAB — POCT URINALYSIS DIPSTICK
Bilirubin, UA: NEGATIVE
GLUCOSE UA: NEGATIVE
Ketones, UA: NEGATIVE
NITRITE UA: NEGATIVE
PROTEIN UA: 100
Spec Grav, UA: 1.015
Urobilinogen, UA: 0.2
pH, UA: 6

## 2013-12-29 LAB — POCT UA - MICROSCOPIC ONLY
Casts, Ur, LPF, POC: NEGATIVE
Crystals, Ur, HPF, POC: NEGATIVE
Mucus, UA: POSITIVE
Yeast, UA: NEGATIVE

## 2013-12-29 MED ORDER — CEFTRIAXONE SODIUM 1 G IJ SOLR
500.0000 mg | Freq: Once | INTRAMUSCULAR | Status: AC
  Administered 2013-12-29: 500 mg via INTRAMUSCULAR

## 2013-12-29 MED ORDER — TRAMADOL HCL 50 MG PO TABS: 50.0000 mg | ORAL_TABLET | Freq: Three times a day (TID) | ORAL | Status: DC | PRN

## 2013-12-29 MED ORDER — SULFAMETHOXAZOLE-TMP DS 800-160 MG PO TABS: 1.0000 | ORAL_TABLET | Freq: Two times a day (BID) | ORAL | Status: DC

## 2013-12-29 NOTE — Patient Instructions (Signed)
Recheck tomorrow for repeat labs.  Take antibiotics 2x.day

## 2013-12-29 NOTE — Progress Notes (Signed)
Subjective:    Patient ID: Meghan Mosley, female    DOB: 07/05/1986, 28 y.o.   MRN: [id number]  HPI Pt presents to clinic with right sided back and flank pain for the last 4 days which is worsening.  She has less pain when laying flat on her back. The day the pain started she had been horseback riding and she did a twist with her dismount on uneven ground and thought that the pain was muscular.  She has increased pain with movement esp twisting but no pain radiation into her legs and when she took motrin and muscle relaxers she did not get much relief.  She is having no urinary symptoms.  No change in urine color, dysuria or frequency.  She is having no vaginal symptoms.  Her last menses was about 3 weeks ago and it was normal.  No h/o UTI or kidney infections or kidney stones.  Used - Flexeril 10mg  - last pm but not really relief of pain  Review of Systems  Gastrointestinal: Positive for nausea (with intense pain only). Negative for abdominal pain.  Genitourinary: Positive for flank pain (right sided). Negative for dysuria, urgency, hematuria, vaginal discharge and dyspareunia.       Objective:   Physical Exam  Vitals reviewed. Constitutional: She is oriented to person, place, and time. She appears well-developed and well-nourished.  HENT:  Head: Normocephalic and atraumatic.  Right Ear: External ear normal.  Left Ear: External ear normal.  Eyes: Conjunctivae are normal.  Neck: Normal range of motion.  Pulmonary/Chest: Effort normal.  Abdominal: There is no tenderness. There is CVA tenderness (right sided).  Musculoskeletal:       Thoracic back: She exhibits tenderness (mild muscle TTP on the right side). She exhibits no bony tenderness.  Neurological: She is alert and oriented to person, place, and time. She has normal strength and normal reflexes. No cranial nerve deficit or sensory deficit.  Reflex Scores:      Bicep reflexes are 2+ on the right side and 2+ on the left side.  Brachioradialis reflexes are 2+ on the right side and 2+ on the left side.      Patellar reflexes are 2+ on the right side and 2+ on the left side. Good strength but some pain in mid back/flank area with strength testing of arms  Skin: Skin is warm and dry.  Psychiatric: She has a normal mood and affect. Her behavior is normal. Judgment and thought content normal.   Results for orders placed in visit on 12/29/13  POCT UA - MICROSCOPIC ONLY      Result Value Ref Range   WBC, Ur, HPF, POC 30-35     RBC, urine, microscopic 15-20     Bacteria, U Microscopic 1+     Mucus, UA positive     Epithelial cells, urine per micros 5-7     Crystals, Ur, HPF, POC neg     Casts, Ur, LPF, POC neg     Yeast, UA neg    POCT URINALYSIS DIPSTICK      Result Value Ref Range   Color, UA yellow     Clarity, UA clear     Glucose, UA neg     Bilirubin, UA neg     Ketones, UA neg     Spec Grav, UA 1.015     Blood, UA small     pH, UA 6.0     Protein, UA 100     Urobilinogen, UA 0.2  Nitrite, UA neg     Leukocytes, UA small (1+)    POCT CBC      Result Value Ref Range   WBC 12.3 (*) 4.6 - 10.2 K/uL   Lymph, poc 1.3  0.6 - 3.4   POC LYMPH PERCENT 10.4  10 - 50 %L   MID (cbc) 0.6  0 - 0.9   POC MID % 4.9  0 - 12 %M   POC Granulocyte 10.4 (*) 2 - 6.9   Granulocyte percent 84.7 (*) 37 - 80 %G   RBC 4.75  4.04 - 5.48 M/uL   Hemoglobin 13.4  12.2 - 16.2 g/dL   HCT, POC 16.141.6  09.637.7 - 47.9 %   MCV 87.6  80 - 97 fL   MCH, POC 28.2  27 - 31.2 pg   MCHC 32.2  31.8 - 35.4 g/dL   RDW, POC 04.514.4     Platelet Count, POC 271  142 - 424 K/uL   MPV 10.1  0 - 99.8 fL       Assessment & Plan:  Flank pain - Plan: POCT UA - Microscopic Only, POCT urinalysis dipstick, Urine culture, traMADol (ULTRAM) 50 MG tablet  Fever, unspecified - Plan: POCT CBC  Acute pyelonephritis - Plan: cefTRIAXone (ROCEPHIN) injection 500 mg, cefTRIAXone (ROCEPHIN) injection 500 mg, sulfamethoxazole-trimethoprim (BACTRIM DS) 800-160  MG per tablet  Recheck tomorrow.  Meds today as directed.  She could have some muscular tenderness but with an abnl urine and WBC I think most of her pain is related to pyelonephritis.  Meghan LennertSarah Rael Yo PA-C  [facility] 12/29/2013 2:19 PM

## 2013-12-30 ENCOUNTER — Ambulatory Visit (INDEPENDENT_AMBULATORY_CARE_PROVIDER_SITE_OTHER): Payer: BC Managed Care – PPO | Admitting: Physician Assistant

## 2013-12-30 VITALS — BP 102/72 | HR 90 | Temp 98.7°F | Resp 16 | Ht 64.5 in | Wt 169.9 lb

## 2013-12-30 DIAGNOSIS — N10 Acute tubulo-interstitial nephritis: Secondary | ICD-10-CM

## 2013-12-30 LAB — POCT CBC
Granulocyte percent: 77.3 %G (ref 37–80)
HCT, POC: 41.7 % (ref 37.7–47.9)
HEMOGLOBIN: 13.1 g/dL (ref 12.2–16.2)
LYMPH, POC: 1.7 (ref 0.6–3.4)
MCH, POC: 27.3 pg (ref 27–31.2)
MCHC: 31.4 g/dL — AB (ref 31.8–35.4)
MCV: 87 fL (ref 80–97)
MID (cbc): 0.8 (ref 0–0.9)
MPV: 8.7 fL (ref 0–99.8)
POC Granulocyte: 8.5 — AB (ref 2–6.9)
POC LYMPH PERCENT: 15.3 %L (ref 10–50)
POC MID %: 7.4 % (ref 0–12)
Platelet Count, POC: 266 10*3/uL (ref 142–424)
RBC: 4.79 M/uL (ref 4.04–5.48)
RDW, POC: 14.5 %
WBC: 11 10*3/uL — AB (ref 4.6–10.2)

## 2013-12-30 NOTE — Progress Notes (Signed)
   Subjective:    Patient ID: Meghan Mosley, female    DOB: 06/30/1986, 28 y.o.   MRN: [id number]  HPI Pt presents to clinic for a recheck of pyelonephritis from yesterday.  She feels much better - the back pain is improved and her fever has resolved.  She is tolerating the bactrim ok, she has had 3 doses since her visit yesterday.  She still has no urinary symptoms.  Review of Systems     Objective:   Physical Exam  Vitals reviewed. Constitutional: She is oriented to person, place, and time. She appears well-developed and well-nourished.  HENT:  Head: Normocephalic and atraumatic.  Right Ear: External ear normal.  Left Ear: External ear normal.  Cardiovascular: Normal rate, regular rhythm and normal heart sounds.   No murmur heard. Pulmonary/Chest: Effort normal and breath sounds normal.  Abdominal: There is CVA tenderness (right sided).  Neurological: She is alert and oriented to person, place, and time.  Skin: Skin is warm and dry.  Psychiatric: She has a normal mood and affect. Her behavior is normal. Judgment and thought content normal.   Results for orders placed in visit on 12/30/13  POCT CBC      Result Value Ref Range   WBC 11.0 (*) 4.6 - 10.2 K/uL   Lymph, poc 1.7  0.6 - 3.4   POC LYMPH PERCENT 15.3  10 - 50 %L   MID (cbc) 0.8  0 - 0.9   POC MID % 7.4  0 - 12 %M   POC Granulocyte 8.5 (*) 2 - 6.9   Granulocyte percent 77.3  37 - 80 %G   RBC 4.79  4.04 - 5.48 M/uL   Hemoglobin 13.1  12.2 - 16.2 g/dL   HCT, POC 40.941.7  81.137.7 - 47.9 %   MCV 87.0  80 - 97 fL   MCH, POC 27.3  27 - 31.2 pg   MCHC 31.4 (*) 31.8 - 35.4 g/dL   RDW, POC 91.414.5     Platelet Count, POC 266  142 - 424 K/uL   MPV 8.7  0 - 99.8 fL       Assessment & Plan:  Acute pyelonephritis - Plan: POCT CBC Improved WBC and symptoms.  Pt will continue her abx and pushing fluids.  Benny LennertSarah Weber PA-C  [facility] 12/30/2013 10:46 AM

## 2014-01-01 LAB — URINE CULTURE: Colony Count: >=100000

## 2014-01-02 ENCOUNTER — Telehealth: Payer: Self-pay

## 2014-01-02 NOTE — Telephone Encounter (Signed)
Pt says she is still fighting off a kidney infection, and would like to know if Benny LennertSarah Weber could continue her Dr.s note, or if she needs to come back in for a visit?

## 2014-01-04 NOTE — Telephone Encounter (Signed)
Tried to call patient voice mail not working properly.

## 2014-01-07 NOTE — Telephone Encounter (Signed)
Spoke to pt, she no longer needs the work note as she has changed jobs.

## 2014-01-08 ENCOUNTER — Telehealth: Payer: Self-pay | Admitting: Family Medicine

## 2014-01-08 NOTE — Telephone Encounter (Signed)
LMOM to CB. 

## 2014-01-08 NOTE — Telephone Encounter (Signed)
Message copied by Tilman NeatPETTY, MICHELLE L on Fri Jan 08, 2014  4:22 PM ------      Message from: Morrell RiddleWEBER, SARAH L      Created: Wed Dec 30, 2013 10:53 AM       Pt would like to make a CPE appt with me.  She is aware that it will be in November            Sarah ------

## 2014-01-11 ENCOUNTER — Telehealth: Payer: Self-pay | Admitting: Family Medicine

## 2014-01-11 MED ORDER — DROSPIRENONE-ETHINYL ESTRADIOL 3-0.02 MG PO TABS: 1.0000 | ORAL_TABLET | Freq: Every day | ORAL | Status: DC

## 2014-01-11 NOTE — Telephone Encounter (Signed)
Pt stated that she is out of her birth control, [place]. She has been out for a "couple weeks" and would like a refill to hold her over until her next appt in October. Pharmacy [facility] [employer].

## 2014-01-27 NOTE — Telephone Encounter (Signed)
LMOM to CB. 

## 2016-09-08 ENCOUNTER — Ambulatory Visit (INDEPENDENT_AMBULATORY_CARE_PROVIDER_SITE_OTHER): Payer: BLUE CROSS/BLUE SHIELD | Admitting: Family Medicine

## 2016-09-08 ENCOUNTER — Ambulatory Visit (INDEPENDENT_AMBULATORY_CARE_PROVIDER_SITE_OTHER): Payer: BLUE CROSS/BLUE SHIELD

## 2016-09-08 VITALS — BP 134/76 | HR 109 | Temp 99.0°F | Resp 16 | Ht 64.0 in | Wt 187.0 lb

## 2016-09-08 DIAGNOSIS — R509 Fever, unspecified: Secondary | ICD-10-CM

## 2016-09-08 DIAGNOSIS — R062 Wheezing: Secondary | ICD-10-CM

## 2016-09-08 DIAGNOSIS — R05 Cough: Secondary | ICD-10-CM

## 2016-09-08 DIAGNOSIS — J069 Acute upper respiratory infection, unspecified: Secondary | ICD-10-CM | POA: Diagnosis not present

## 2016-09-08 DIAGNOSIS — R059 Cough, unspecified: Secondary | ICD-10-CM

## 2016-09-08 DIAGNOSIS — B9789 Other viral agents as the cause of diseases classified elsewhere: Secondary | ICD-10-CM | POA: Diagnosis not present

## 2016-09-08 LAB — POCT INFLUENZA A/B
INFLUENZA A, POC: NEGATIVE
Influenza B, POC: NEGATIVE

## 2016-09-08 NOTE — Progress Notes (Signed)
   Meghan HarrierSara Renstrom is a 31 y.o. female who presents to Primary Care at [facility] today for cough and fever:  1.  Cough and fever:  Patient's symptoms started about 3-4 days ago.  Cough alternates between dry hacking and productive of thin yellow sputum. She has had subjective fevers at home. Some chills. No chest pain. No orthopnea. Also with sinus congestion and runny nose. She's been eating and drinking well. No nausea or vomiting.  Husband diagnosed with sinus infection, but no other sick contacts she knows of.    ROS as above.    PMH reviewed. Patient is a nonsmoker.   No past medical history on file. Past Surgical History:  Procedure Laterality Date  . MOUTH SURGERY  2008   wisdom teeth, have re grown    Medications reviewed. Current Outpatient Prescriptions  Medication Sig Dispense Refill  . drospirenone-ethinyl estradiol (LORYNA) 3-0.02 MG tablet Take 1 tablet by mouth daily. PATIENT NEEDS OFFICE VISIT FOR ADDITIONAL REFILLS 28 tablet 6  . sulfamethoxazole-trimethoprim (BACTRIM DS) 800-160 MG per tablet Take 1 tablet by mouth 2 (two) times daily. (Patient not taking: Reported on 09/08/2016) 20 tablet 0  . traMADol (ULTRAM) 50 MG tablet Take 1 tablet (50 mg total) by mouth every 8 (eight) hours as needed. (Patient not taking: Reported on 09/08/2016) 20 tablet 0   No current facility-administered medications for this visit.      Physical Exam:  BP 134/76   Pulse (!) 109   Temp 99 F (37.2 C)   Resp 16   Ht 5\' 4"  (1.626 m)   Wt 187 lb (84.8 kg)   LMP 09/01/2016 (Approximate)   SpO2 98%   BMI 32.10 kg/m  Gen:  Patient sitting on exam table, appears stated age in no acute distress.  Well appearing.   Head: Normocephalic atraumatic Eyes: EOMI, PERRL, sclera and conjunctiva non-erythematous Ears:  Canals clear bilaterally.  TMs pearly gray bilaterally without erythema or bulging.   Nose:  Nasal turbinates with some erythema BL and thin, clear exudates.   Mouth: Mucosa membranes  moist. Tonsils +2, nonenlarged, non-erythematous. Neck: No cervical lymphadenopathy noted Heart:  RRR, no murmurs auscultated. Pulm:  Faint wheezes at bases, resolves with cough.    Results for orders placed or performed in visit on 09/08/16  POCT Influenza A/B  Result Value Ref Range   Influenza A, POC Negative Negative   Influenza B, POC Negative Negative    Assessment and Plan:  1.  Flu like symptoms: - negative flu here.  Symptoms started >48 hours ago.  No indications for Tamiflu today. - well appearing today on exam.  - CXR negative.   - Likely viral illness that will run its course.   - FU if no improvement.

## 2016-09-08 NOTE — Patient Instructions (Addendum)
Your flu test and chest xray were both negative, which is good news.   You likely have a viral illness that should run its course in the next several days.  If you're still feeling bad in a week, let us know.  It was good to see you today!    IF you received an x-ray today, you will receive an invoice from [facility]. Please contact [facility] at [phone] with questions or concerns regarding your invoice.   IF you received labwork today, you will receive an invoice from [facility]. Please contact [facility] at [phone] with questions or concerns regarding your invoice.   Our billing staff will not be able to assist you with questions regarding bills from these companies.  You will be contacted with the lab results as soon as they are available. The fastest way to get your results is to activate your My Chart account. Instructions are located on the last page of this paperwork. If you have not heard from us regarding the results in 2 weeks, please contact this office.

## 2016-09-13 ENCOUNTER — Ambulatory Visit (INDEPENDENT_AMBULATORY_CARE_PROVIDER_SITE_OTHER): Payer: BLUE CROSS/BLUE SHIELD | Admitting: Family Medicine

## 2016-09-13 ENCOUNTER — Telehealth: Payer: Self-pay

## 2016-09-13 VITALS — BP 142/70 | Temp 98.5°F | Resp 16 | Ht 64.0 in | Wt 186.0 lb

## 2016-09-13 DIAGNOSIS — J0100 Acute maxillary sinusitis, unspecified: Secondary | ICD-10-CM

## 2016-09-13 MED ORDER — AMOXICILLIN 875 MG PO TABS: 875.0000 mg | ORAL_TABLET | Freq: Two times a day (BID) | ORAL | 0 refills | Status: DC

## 2016-09-13 MED ORDER — HYDROCODONE-HOMATROPINE 5-1.5 MG/5ML PO SYRP: 5.0000 mL | ORAL_SOLUTION | Freq: Three times a day (TID) | ORAL | 0 refills | Status: DC | PRN

## 2016-09-13 MED ORDER — PREDNISONE 10 MG PO TABS: ORAL_TABLET | ORAL | 0 refills | Status: DC

## 2016-09-13 NOTE — Telephone Encounter (Signed)
PATIENT STATES SHE SAW DR. Gwendolyn GrantWALDEN LAST Saturday FOR FLU-LIKE SYMPTOMS AND HE TOLD HER TO CALL HIM BACK IF SHE WAS NOT BETTER BY Thursday. SHE SAID HER COUGH IS WORSE AND HER NECK BELOW HER (R) EAR HURTS. HE DID NOT PRESCRIBE HER TO HAVE ANY MEDICINE. BEST PHONE [phone] (CELL) PHARMACY CHOICE IS [facility] ON [address]. MBC

## 2016-09-13 NOTE — Telephone Encounter (Signed)
Spoke with patient. Advised her to RTC for re-evaluation so that proper treatment can be prescribed. Transferred to [facility] to schedule visit.

## 2016-09-13 NOTE — Progress Notes (Signed)
   Meghan HarrierSara Chewning is a 31 y.o. female who presents to [facility] at [facility] today for continued URI symptoms:   1.  URI:  Patient seen here about a week ago. Diagnosed that appointment with viral URI. Since then she has had continued symptoms. She has not further fevers. However she has had worsening sinus congestion and pain. This is worse in the right side but it is bilateral. She has also had some right-sided ear pain and decreased hearing. She's had no tinnitus or vertigo symptoms.  She has had a continued cough. Cough is occasionally productive but mostly dry and hacking. She does have yellowish sputum when she blows her nose. Her nose feels very congested and is difficult to breathe through her nose especially at night. She is using over-the-counter nasal saline sprays with only minimal relief. No abdominal pain. No nausea or vomiting. No dyspnea.  ROS as above.   PMH reviewed. Patient is a nonsmoker.   No past medical history on file. Past Surgical History:  Procedure Laterality Date  . MOUTH SURGERY  2008   wisdom teeth, have re grown    Medications reviewed. Current Outpatient Prescriptions  Medication Sig Dispense Refill  . drospirenone-ethinyl estradiol (LORYNA) 3-0.02 MG tablet Take 1 tablet by mouth daily. PATIENT NEEDS OFFICE VISIT FOR ADDITIONAL REFILLS 28 tablet 6   No current facility-administered medications for this visit.      Physical Exam:  BP (!) 142/70   Temp 98.5 F (36.9 C) (Other (Comment))   Resp 16   Ht 5\' 4"  (1.626 m)   Wt 186 lb (84.4 kg)   LMP 09/01/2016 (Approximate)   SpO2 98%   BMI 31.93 kg/m  Gen:  Patient sitting on exam table, appears stated age in no acute distress Head: Normocephalic atraumatic Eyes: EOMI, PERRL, sclera and conjunctiva non-erythematous Ears:  Canals clear bilaterally.  TMs pearly gray bilaterally, right side with some fluid behind TM Nose:  Nasal turbinates grossly enlarged bilaterally. Some exudates noted. Tender to  palpation of maxillary sinus  Mouth: Mucosa membranes moist. Tonsils +2, nonenlarged, non-erythematous. Neck: No cervical lymphadenopathy noted Heart:  RRR, no murmurs auscultated. Pulm:  Clear to auscultation bilaterally with good air movement.  No wheezes or rales noted.    Assessment and Plan:  1.  Sinusitis: - treat with amoxicillin - secondary to recent viral URI - FU if on improvement.   2.  Cough : - Treat with hycodan.   - Prednisone will also help with this.   3.  Eustachian tube dysfunction:  - continue nasal saline.  This should improve once sinusitis improves.  - To FU with us if no improvement in next 7 - 10 days.

## 2016-09-13 NOTE — Patient Instructions (Addendum)
Take the Amoxicillin twice dialy for the next 10 days.  Take the Prednisone starting tomorrow as Take 6 pills today, 5 pills tomorrow, 4 pills today after, 3 pills after, 2 pills after, 1 pill last day  Use they Hycodan for relief of your cough.    You should be feeling better in the next several days.  If not, come back and see us.  I'm sorry you're not feeling better now!    IF you received an x-ray today, you will receive an invoice from [facility]. Please contact [facility] at [phone] with questions or concerns regarding your invoice.   IF you received labwork today, you will receive an invoice from [facility]. Please contact [facility] at [phone] with questions or concerns regarding your invoice.   Our billing staff will not be able to assist you with questions regarding bills from these companies.  You will be contacted with the lab results as soon as they are available. The fastest way to get your results is to activate your My Chart account. Instructions are located on the last page of this paperwork. If you have not heard from us regarding the results in 2 weeks, please contact this office.

## 2016-09-21 ENCOUNTER — Telehealth: Payer: Self-pay

## 2016-09-21 NOTE — Telephone Encounter (Signed)
Feeling much better until yesterday now her ear is plugged and painful again. She has 2 days left of amox which she will finish.  no new fever.  I advised decongestant otc.  suggestions?

## 2016-09-21 NOTE — Telephone Encounter (Addendum)
PATIENT STATES SHE SAW DR. Gwendolyn GrantWALDEN ON LAST Thursday (09/13/16) FOR A SINUS INFECTION. HE GAVE HER SOME MEDICINE INCLUDING AMOXICILLIN. SHE WAS STARTING TO FEEL BETTER, BUT LAST NIGHT (THURS) HER EAR AND THROAT STARTING HURTING ON THE (R) SIDE. SHE HAS 2 DAYS LEFT OF THE AMOXICILLIN. SHOULD SHE GET A REFILL OR DOES SHE HAVE TO COME IN TO BE SEEN AGAIN? BEST PHONE [phone] (CELL) [facility] [address]. MBC

## 2016-09-21 NOTE — Telephone Encounter (Signed)
Finish 2 days of amoxicillin.  This is likely eustachian tube dysfunction, which the antibiotics won't make better.  If she starts having a fever, she should come back.  Otherwise she should try decongestants to help open the eustachian tubes.    If the pain continues to get worse, she should come back.  Thanks!  JW

## 2016-09-21 NOTE — Telephone Encounter (Signed)
Pt advised.

## 2017-04-07 IMAGING — DX DG CHEST 2V
2 series · 2 of 2 positions shown · non-contrast
Comparison: None.

CLINICAL DATA: Cough, fever, and wheezing.

EXAM:
CHEST  2 VIEW

[chest pa]
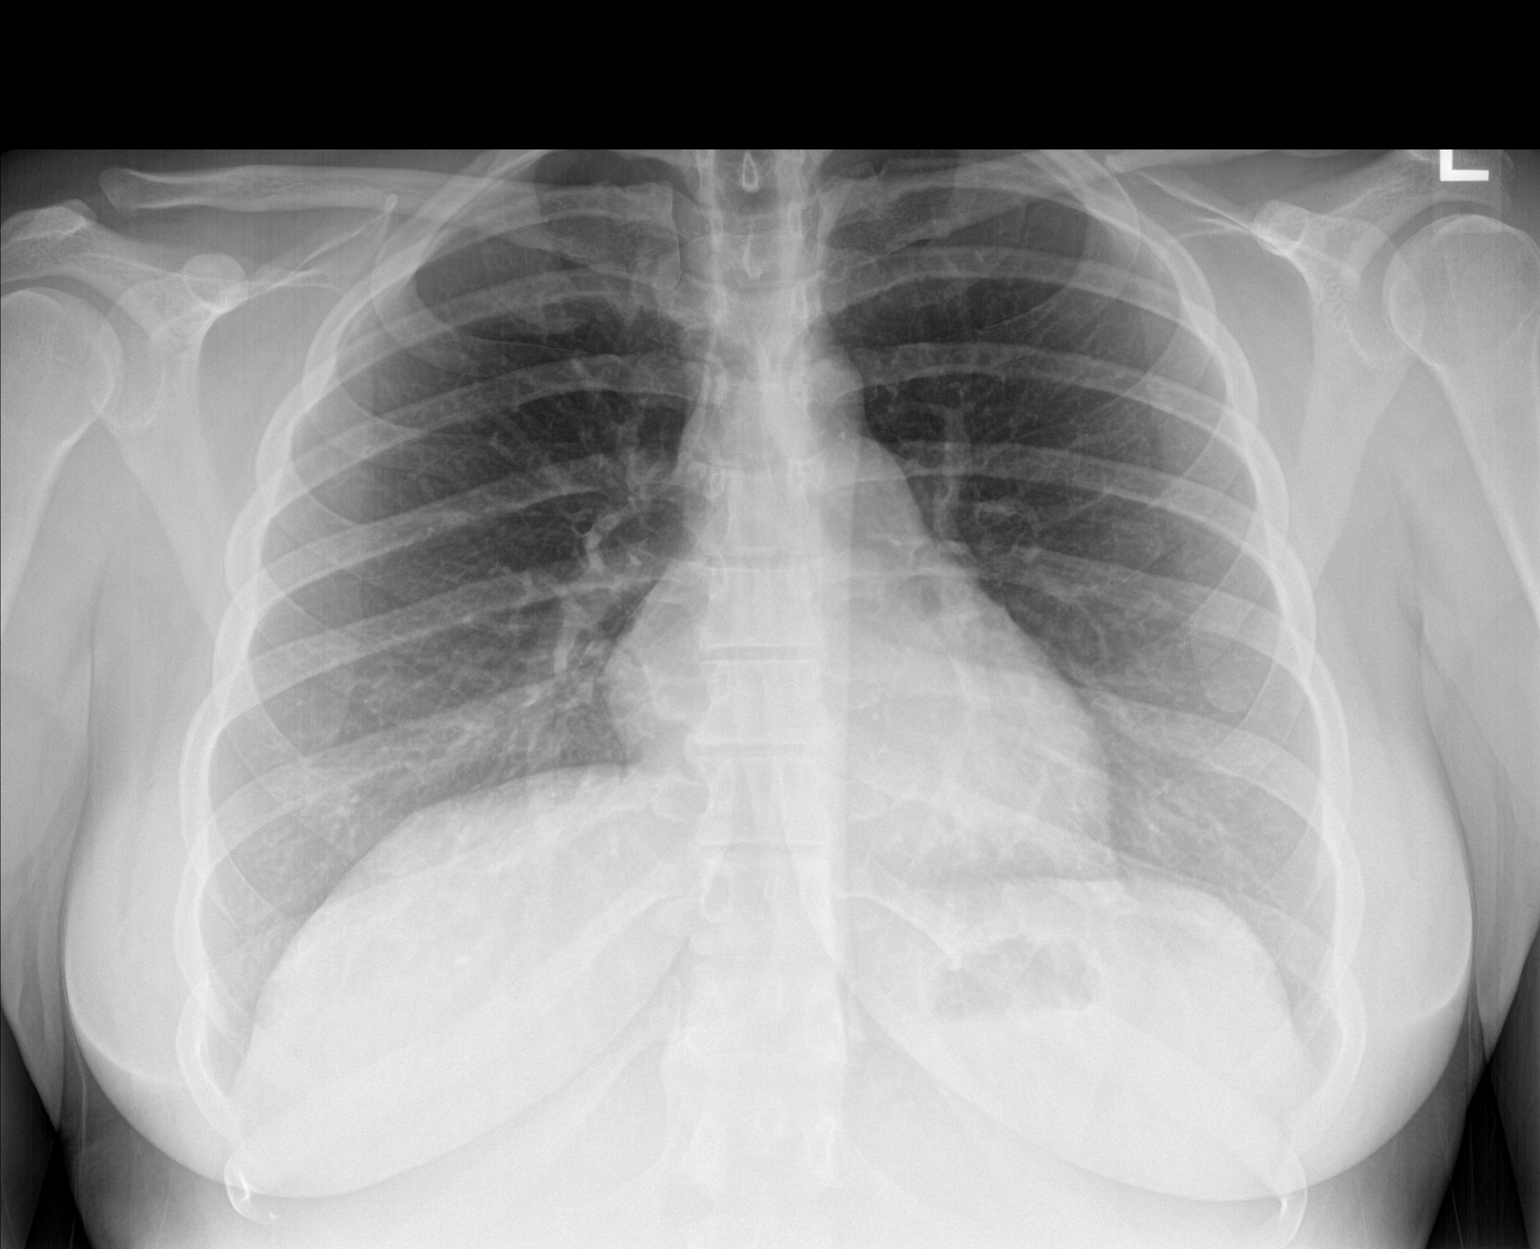

[chest lat]
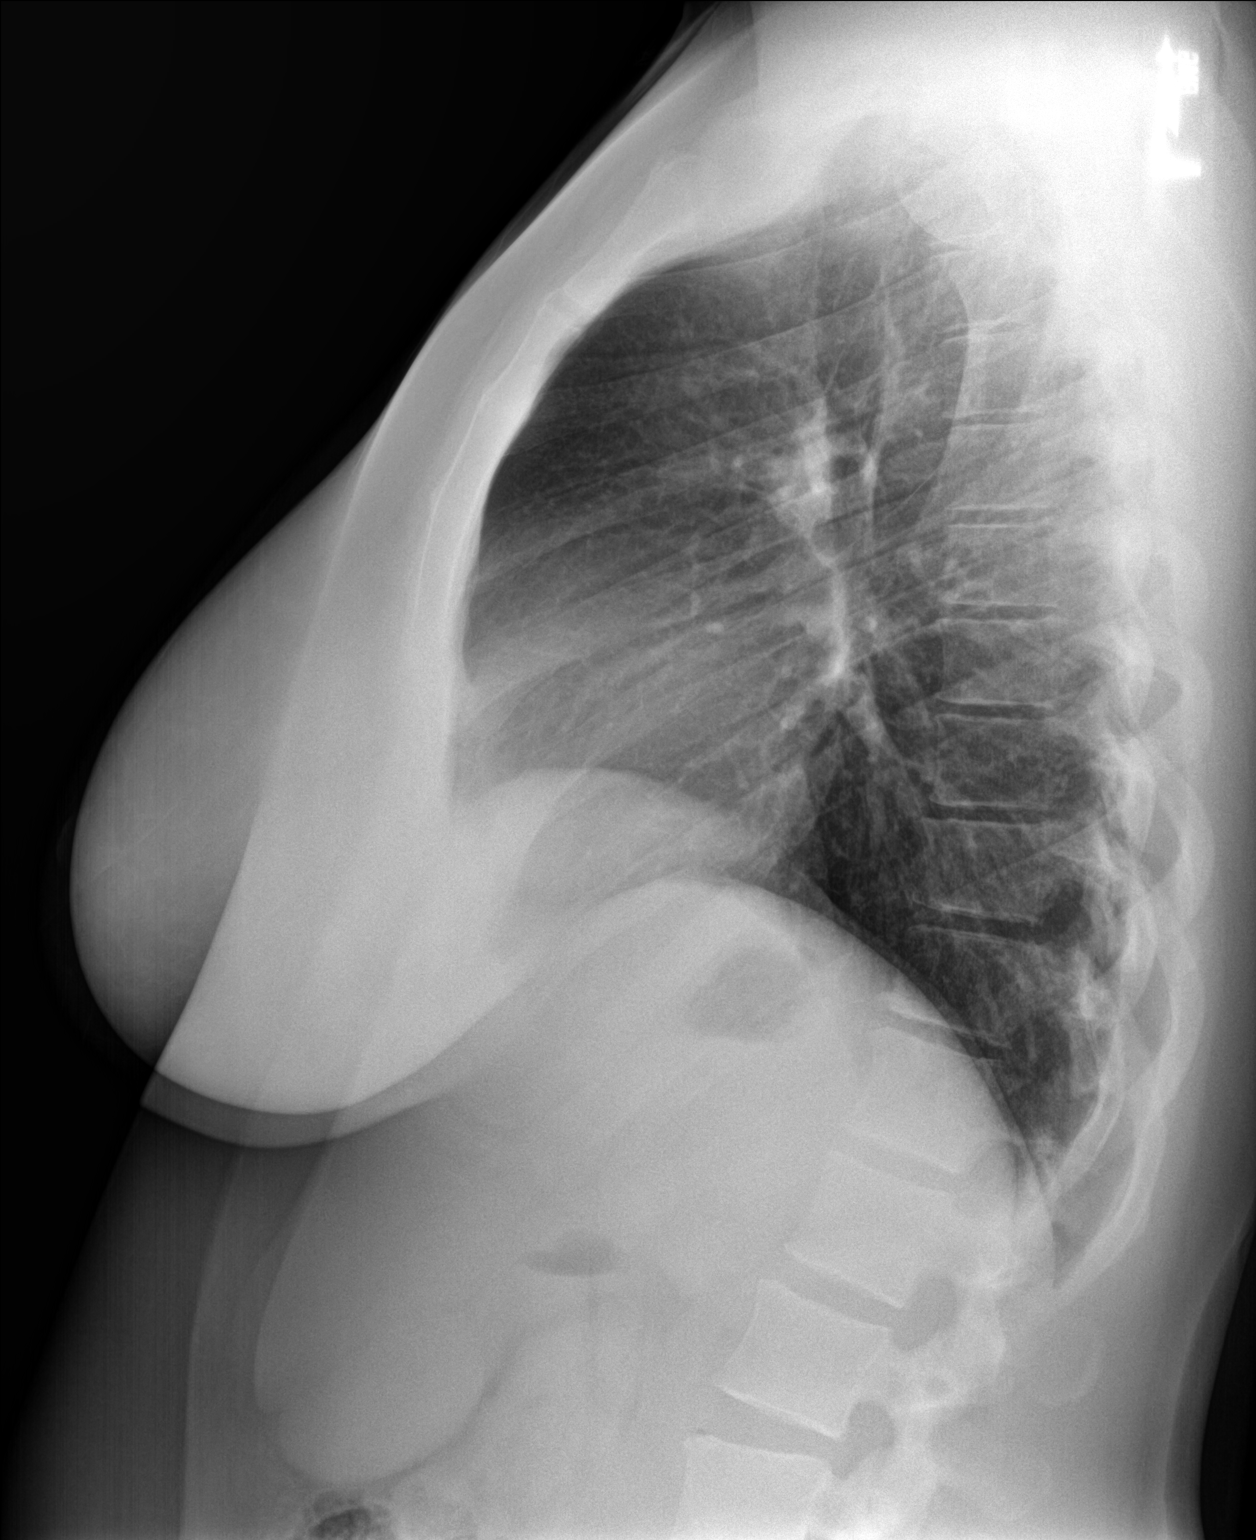

[2 of 2 positions shown; findings below may reference images not displayed]

FINDINGS: The heart size and mediastinal contours are within normal limits.
Both lungs are clear. The visualized skeletal structures are
unremarkable.
IMPRESSION: No active cardiopulmonary disease.

## 2018-05-05 ENCOUNTER — Other Ambulatory Visit: Payer: Self-pay

## 2018-05-05 ENCOUNTER — Ambulatory Visit (INDEPENDENT_AMBULATORY_CARE_PROVIDER_SITE_OTHER): Payer: BLUE CROSS/BLUE SHIELD | Admitting: Family Medicine

## 2018-05-05 ENCOUNTER — Encounter: Payer: Self-pay | Admitting: Family Medicine

## 2018-05-05 VITALS — BP 151/88 | HR 79 | Temp 98.2°F | Ht 63.0 in | Wt 189.0 lb

## 2018-05-05 DIAGNOSIS — L03313 Cellulitis of chest wall: Secondary | ICD-10-CM | POA: Diagnosis not present

## 2018-05-05 MED ORDER — DOXYCYCLINE HYCLATE 100 MG PO TABS: 100.0000 mg | ORAL_TABLET | Freq: Two times a day (BID) | ORAL | 0 refills | Status: DC

## 2018-05-05 NOTE — Patient Instructions (Addendum)
I suspect you have a cellulitis of the skin on the front of the chest wall.  With the previous bump, that could be a cyst or infected cyst, but I do not appreciate an abscess to incise at this time.  Use warm compresses 4-5 times daily, ibuprofen over-the-counter as needed up to 600 mg every 6 hours, start doxycycline, and recheck either with your OB/GYN tomorrow or with 1 of our providers in the next 24 to 48 hours to decide if incision is needed at that time.  Return to the clinic or go to the nearest emergency room if any of your symptoms worsen or new symptoms occur.   Cellulitis, Adult Cellulitis is a skin infection. The infected area is usually red and tender. This condition occurs most often in the arms and lower legs. The infection can travel to the muscles, blood, and underlying tissue and become serious. It is very important to get treated for this condition. What are the causes? Cellulitis is caused by bacteria. The bacteria enter through a break in the skin, such as a cut, burn, insect bite, open sore, or crack. What increases the risk? This condition is more likely to occur in people who:  Have a weak defense system (immune system).  Have open wounds on the skin such as cuts, burns, bites, and scrapes. Bacteria can enter the body through these open wounds.  Are older.  Have diabetes.  Have a type of long-lasting (chronic) liver disease (cirrhosis) or kidney disease.  Use IV drugs.  What are the signs or symptoms? Symptoms of this condition include:  Redness, streaking, or spotting on the skin.  Swollen area of the skin.  Tenderness or pain when an area of the skin is touched.  Warm skin.  Fever.  Chills.  Blisters.  How is this diagnosed? This condition is diagnosed based on a medical history and physical exam. You may also have tests, including:  Blood tests.  Lab tests.  Imaging tests.  How is this treated? Treatment for this condition may  include:  Medicines, such as antibiotic medicines or antihistamines.  Supportive care, such as rest and application of cold or warm cloths (cold or warm compresses) to the skin.  Hospital care, if the condition is severe.  The infection usually gets better within 1-2 days of treatment. Follow these instructions at home:  Take over-the-counter and prescription medicines only as told by your health care provider.  If you were prescribed an antibiotic medicine, take it as told by your health care provider. Do not stop taking the antibiotic even if you start to feel better.  Drink enough fluid to keep your urine clear or pale yellow.  Do not touch or rub the infected area.  Raise (elevate) the infected area above the level of your heart while you are sitting or lying down.  Apply warm or cold compresses to the affected area as told by your health care provider.  Keep all follow-up visits as told by your health care provider. This is important. These visits let your health care provider make sure a more serious infection is not developing. Contact a health care provider if:  You have a fever.  Your symptoms do not improve within 1-2 days of starting treatment.  Your bone or joint underneath the infected area becomes painful after the skin has healed.  Your infection returns in the same area or another area.  You notice a swollen bump in the infected area.  You develop new  symptoms.  You have a general ill feeling (malaise) with muscle aches and pains. Get help right away if:  Your symptoms get worse.  You feel very sleepy.  You develop vomiting or diarrhea that persists.  You notice red streaks coming from the infected area.  Your red area gets larger or turns dark in color. This information is not intended to replace advice given to you by your health care provider. Make sure you discuss any questions you have with your health care provider. Document Released:  04/11/2005 Document Revised: 11/10/2015 Document Reviewed: 05/11/2015 Elsevier Interactive Patient Education  [employer].   If you have lab work done today you will be contacted with your lab results within the next 2 weeks.  If you have not heard from [place] then please contact us. The fastest way to get your results is to register for My Chart.   IF you received an x-ray today, you will receive an invoice from [facility]. Please contact [facility] at [phone] with questions or concerns regarding your invoice.   IF you received labwork today, you will receive an invoice from [place]. Please contact [facility] at [phone] with questions or concerns regarding your invoice.   Our billing staff will not be able to assist you with questions regarding bills from these companies.  You will be contacted with the lab results as soon as they are available. The fastest way to get your results is to activate your My Chart account. Instructions are located on the last page of this paperwork. If you have not heard from [place] regarding the results in 2 weeks, please contact this office.

## 2018-05-05 NOTE — Progress Notes (Signed)
Subjective:  By signing my name below, I, Stann Ore, attest that this documentation has been prepared under the direction and in the presence of Meredith Staggers, MD. Electronically Signed: Stann Ore, Scribe. 05/05/2018 , 1:52 PM .  Patient was seen in Room 10 .   Patient ID: Meghan Mosley, female    DOB: 10-26-1985, 32 y.o.   MRN: [id number] Chief Complaint  Patient presents with  . cyst on chest    started as a pea size a month ago   HPI Meghan Mosley is a 32 y.o. female Patient complains of a cyst on her chest about 1 month ago. She reports the cyst was about the size of her pinky nail initially. She was going to hold off until her OBGYN visit tomorrow. However, she went to the beach last week, and noticed the area gotten slightly larger. In the past few days, the area has gotten larger and hurting more. She denies applying lotions or salves to the area. She's been washing the area more with hot water. She denies fever, chills or fatigue. She mentions history of pyelonephritis. She was taking ibuprofen in the days prior, but none today.   Her husband is present in room. Her OBGYN is Dr. Langston Masker at [facility].   There are no active problems to display for this patient.  No past medical history on file. Past Surgical History:  Procedure Laterality Date  . MOUTH SURGERY  2008   wisdom teeth, have re grown   Allergies  Allergen Reactions  . Tramadol Diarrhea and Nausea And Vomiting   Prior to Admission medications   Not on File   Social History   Socioeconomic History  . Marital status: Single    Spouse name: Not on file  . Number of children: Not on file  . Years of education: Not on file  . Highest education level: Not on file  Occupational History  . Not on file  Social Needs  . Financial resource strain: Not on file  . Food insecurity:    Worry: Not on file    Inability: Not on file  . Transportation needs:    Medical: Not on file    Non-medical:  Not on file  Tobacco Use  . Smoking status: Never Smoker  . Smokeless tobacco: Never Used  Substance and Sexual Activity  . Alcohol use: Yes    Comment: social  . Drug use: No  . Sexual activity: Yes    Birth control/protection: Pill, Condom  Lifestyle  . Physical activity:    Days per week: Not on file    Minutes per session: Not on file  . Stress: Not on file  Relationships  . Social connections:    Talks on phone: Not on file    Gets together: Not on file    Attends religious service: Not on file    Active member of club or organization: Not on file    Attends meetings of clubs or organizations: Not on file    Relationship status: Not on file  . Intimate partner violence:    Fear of current or ex partner: Not on file    Emotionally abused: Not on file    Physically abused: Not on file    Forced sexual activity: Not on file  Other Topics Concern  . Not on file  Social History Narrative  . Not on file   Review of Systems  Constitutional: Negative for chills, fatigue, fever and unexpected weight change.  Respiratory: Negative for cough.   Gastrointestinal: Negative for constipation, diarrhea, nausea and vomiting.  Skin: Negative for rash and wound.       Cyst on chest  Neurological: Negative for dizziness, weakness and headaches.       Objective:   Physical Exam  Constitutional: She is oriented to person, place, and time. She appears well-developed and well-nourished. No distress.  HENT:  Head: Normocephalic and atraumatic.  Eyes: Pupils are equal, round, and reactive to light. EOM are normal.  Neck: Neck supple.  Cardiovascular: Normal rate.  Pulmonary/Chest: Effort normal. No respiratory distress.  Musculoskeletal: Normal range of motion.  Neurological: She is alert and oriented to person, place, and time.  Skin: Skin is warm and dry.  between the breasts, there is an indurated area with overlying erythema measuring 4.5 cm vertically by 2.5 cm; don't feel a  distinct fluctuant center, feels swollen throughout, no axillary or supraclavicular pain  Psychiatric: She has a normal mood and affect. Her behavior is normal.  Nursing note and vitals reviewed.   Vitals:   05/05/18 1317  BP: (!) 151/88  Pulse: 79  Temp: 98.2 F (36.8 C)  TempSrc: Oral  SpO2: 95%  Weight: 189 lb (85.7 kg)  Height: 5\' 3"  (1.6 m)       Assessment & Plan:    Meghan Mosley is a 32 y.o. female Cellulitis of chest wall - Plan: doxycycline (VIBRA-TABS) 100 MG tablet Reported possible cyst, with increase in size, now with erythema, more induration.  I do not appreciate a central fluctuant area/abscess, but differential includes early abscess.    - Decided to try warm compresses for the next 24 to 48 hours, start doxycycline.  Has follow-up planned with OB/GYN tomorrow, can have checked there or return here with work in provider tomorrow or Wednesday to decide on I and D. RTC/ER precautions.   Meds ordered this encounter  Medications  . doxycycline (VIBRA-TABS) 100 MG tablet    Sig: Take 1 tablet (100 mg total) by mouth 2 (two) times daily.    Dispense:  20 tablet    Refill:  0   Patient Instructions    I suspect you have a cellulitis of the skin on the front of the chest wall.  With the previous bump, that could be a cyst or infected cyst, but I do not appreciate an abscess to incise at this time.  Use warm compresses 4-5 times daily, ibuprofen over-the-counter as needed up to 600 mg every 6 hours, start doxycycline, and recheck either with your OB/GYN tomorrow or with 1 of our providers in the next 24 to 48 hours to decide if incision is needed at that time.  Return to the clinic or go to the nearest emergency room if any of your symptoms worsen or new symptoms occur.   Cellulitis, Adult Cellulitis is a skin infection. The infected area is usually red and tender. This condition occurs most often in the arms and lower legs. The infection can travel to the muscles,  blood, and underlying tissue and become serious. It is very important to get treated for this condition. What are the causes? Cellulitis is caused by bacteria. The bacteria enter through a break in the skin, such as a cut, burn, insect bite, open sore, or crack. What increases the risk? This condition is more likely to occur in people who:  Have a weak defense system (immune system).  Have open wounds on the skin such as cuts, burns, bites, and  scrapes. Bacteria can enter the body through these open wounds.  Are older.  Have diabetes.  Have a type of long-lasting (chronic) liver disease (cirrhosis) or kidney disease.  Use IV drugs.  What are the signs or symptoms? Symptoms of this condition include:  Redness, streaking, or spotting on the skin.  Swollen area of the skin.  Tenderness or pain when an area of the skin is touched.  Warm skin.  Fever.  Chills.  Blisters.  How is this diagnosed? This condition is diagnosed based on a medical history and physical exam. You may also have tests, including:  Blood tests.  Lab tests.  Imaging tests.  How is this treated? Treatment for this condition may include:  Medicines, such as antibiotic medicines or antihistamines.  Supportive care, such as rest and application of cold or warm cloths (cold or warm compresses) to the skin.  Hospital care, if the condition is severe.  The infection usually gets better within 1-2 days of treatment. Follow these instructions at home:  Take over-the-counter and prescription medicines only as told by your health care provider.  If you were prescribed an antibiotic medicine, take it as told by your health care provider. Do not stop taking the antibiotic even if you start to feel better.  Drink enough fluid to keep your urine clear or pale yellow.  Do not touch or rub the infected area.  Raise (elevate) the infected area above the level of your heart while you are sitting or lying  down.  Apply warm or cold compresses to the affected area as told by your health care provider.  Keep all follow-up visits as told by your health care provider. This is important. These visits let your health care provider make sure a more serious infection is not developing. Contact a health care provider if:  You have a fever.  Your symptoms do not improve within 1-2 days of starting treatment.  Your bone or joint underneath the infected area becomes painful after the skin has healed.  Your infection returns in the same area or another area.  You notice a swollen bump in the infected area.  You develop new symptoms.  You have a general ill feeling (malaise) with muscle aches and pains. Get help right away if:  Your symptoms get worse.  You feel very sleepy.  You develop vomiting or diarrhea that persists.  You notice red streaks coming from the infected area.  Your red area gets larger or turns dark in color. This information is not intended to replace advice given to you by your health care provider. Make sure you discuss any questions you have with your health care provider. Document Released: 04/11/2005 Document Revised: 11/10/2015 Document Reviewed: 05/11/2015 Elsevier Interactive Patient Education  [employer].   If you have lab work done today you will be contacted with your lab results within the next 2 weeks.  If you have not heard from [place] then please contact us. The fastest way to get your results is to register for My Chart.   IF you received an x-ray today, you will receive an invoice from [facility]. Please contact [facility] at [phone] with questions or concerns regarding your invoice.   IF you received labwork today, you will receive an invoice from [facility]. Please contact [facility] at [phone] with questions or concerns regarding your invoice.   Our billing staff will not be able to assist you with questions regarding  bills from these companies.  You  will be contacted with the lab results as soon as they are available. The fastest way to get your results is to activate your My Chart account. Instructions are located on the last page of this paperwork. If you have not heard from [place] regarding the results in 2 weeks, please contact this office.      I personally performed the services described in this documentation, which was scribed in my presence. The recorded information has been reviewed and considered for accuracy and completeness, addended by me as needed, and agree with information above.  Signed,   Meredith Staggers, MD [facility].  05/05/18 2:02 PM

## 2018-08-14 ENCOUNTER — Encounter: Payer: BLUE CROSS/BLUE SHIELD | Admitting: Family Medicine

## 2019-03-24 ENCOUNTER — Other Ambulatory Visit: Payer: Self-pay

## 2019-03-24 ENCOUNTER — Ambulatory Visit (INDEPENDENT_AMBULATORY_CARE_PROVIDER_SITE_OTHER): Payer: No Typology Code available for payment source | Admitting: Family Medicine

## 2019-03-24 DIAGNOSIS — Z23 Encounter for immunization: Secondary | ICD-10-CM

## 2019-03-30 ENCOUNTER — Ambulatory Visit: Payer: Self-pay | Admitting: Family Medicine

## 2019-04-06 ENCOUNTER — Other Ambulatory Visit: Payer: Self-pay

## 2019-04-06 DIAGNOSIS — Z20822 Contact with and (suspected) exposure to covid-19: Secondary | ICD-10-CM

## 2019-04-08 LAB — NOVEL CORONAVIRUS, NAA: SARS-CoV-2, NAA: NOT DETECTED

## 2019-10-16 ENCOUNTER — Ambulatory Visit: Payer: Self-pay | Attending: Internal Medicine

## 2019-10-16 DIAGNOSIS — Z23 Encounter for immunization: Secondary | ICD-10-CM

## 2019-10-16 NOTE — Progress Notes (Signed)
   Covid-19 Vaccination Clinic  Name:  Meghan Mosley    MRN: [id number] DOB: 1986/06/06  10/16/2019  [name] was observed post Covid-19 immunization for 15 minutes without incident. She was provided with Vaccine Information Sheet and instruction to access the V-Safe system.   [name] was instructed to call 911 with any severe reactions post vaccine: Marland Kitchen Difficulty breathing  . Swelling of face and throat  . A fast heartbeat  . A bad rash all over body  . Dizziness and weakness   Immunizations Administered    Name Date Dose VIS Date Route   Pfizer COVID-19 Vaccine 10/16/2019  9:18 AM 0.3 mL 06/26/2019 Intramuscular   Manufacturer: [employer], [employer]   Lot: [id number]   NDC: [address]-5

## 2019-11-10 ENCOUNTER — Ambulatory Visit: Payer: Self-pay | Attending: Internal Medicine

## 2019-11-10 DIAGNOSIS — Z23 Encounter for immunization: Secondary | ICD-10-CM

## 2019-11-10 NOTE — Progress Notes (Signed)
   Covid-19 Vaccination Clinic  Name:  Meghan Mosley    MRN: [id number] DOB: 01-31-1986  11/10/2019  Ms. Albornoz was observed post Covid-19 immunization for 30 minutes based on pre-vaccination screening without incident. She was provided with Vaccine Information Sheet and instruction to access the V-Safe system.   Ms. Blankley was instructed to call 911 with any severe reactions post vaccine: Marland Kitchen Difficulty breathing  . Swelling of face and throat  . A fast heartbeat  . A bad rash all over body  . Dizziness and weakness   Immunizations Administered    Name Date Dose VIS Date Route   Pfizer COVID-19 Vaccine 11/10/2019  8:15 AM 0.3 mL 09/09/2018 Intramuscular   Manufacturer: [employer], [employer]   Lot: [id number]   NDC: [address]-8   Pfizer COVID-19 Vaccine 11/10/2019  8:16 AM 0.3 mL 09/09/2018 Intramuscular   Manufacturer: [employer], [employer]   Lot: [id number]   NDC: [address]-9

## 2019-11-17 ENCOUNTER — Encounter: Payer: Self-pay | Admitting: Registered Nurse

## 2019-11-17 ENCOUNTER — Ambulatory Visit: Payer: No Typology Code available for payment source | Admitting: Registered Nurse

## 2019-11-17 ENCOUNTER — Other Ambulatory Visit: Payer: Self-pay

## 2019-11-17 VITALS — BP 121/85 | HR 96 | Temp 98.4°F | Resp 16 | Ht 63.0 in | Wt 183.8 lb

## 2019-11-17 DIAGNOSIS — R21 Rash and other nonspecific skin eruption: Secondary | ICD-10-CM | POA: Diagnosis not present

## 2019-11-17 DIAGNOSIS — T7840XA Allergy, unspecified, initial encounter: Secondary | ICD-10-CM | POA: Diagnosis not present

## 2019-11-17 MED ORDER — HYDROXYZINE HCL 25 MG PO TABS: 25.0000 mg | ORAL_TABLET | Freq: Every day | ORAL | 0 refills | Status: DC

## 2019-11-17 MED ORDER — PREDNISONE 10 MG (21) PO TBPK: ORAL_TABLET | ORAL | 0 refills | Status: DC

## 2019-11-17 NOTE — Patient Instructions (Signed)
° ° ° °  If you have lab work done today you will be contacted with your lab results within the next 2 weeks.  If you have not heard from us then please contact us. The fastest way to get your results is to register for My Chart. ° ° °IF you received an x-ray today, you will receive an invoice from [facility]. Please contact  Radiology at [phone] with questions or concerns regarding your invoice.  ° °IF you received labwork today, you will receive an invoice from [facility]. Please contact [facility] at [phone] with questions or concerns regarding your invoice.  ° °Our billing staff will not be able to assist you with questions regarding bills from these companies. ° °You will be contacted with the lab results as soon as they are available. The fastest way to get your results is to activate your My Chart account. Instructions are located on the last page of this paperwork. If you have not heard from us regarding the results in 2 weeks, please contact this office. °  ° ° ° °

## 2019-11-17 NOTE — Progress Notes (Signed)
Acute Office Visit  Subjective:    Patient ID: Meghan Mosley, female    DOB: [birth year], 34 y.o.   MRN: [id number]  Chief Complaint  Patient presents with  . Allergic Reaction    patient states since saturday she thought she has a rash on the right lid. Per patient she just thought it was just something she was near while visiting the mountains but now Rash has spreaded all over face. Both eyes are swollen, red, anfd itchy    HPI Patient is in today for rash on eyelids  Started Saturday, worsened Sunday and Monday, today is definitely worst yet Red, swollen, itchy, burning No known no exposures - tried a new detergent a few weeks ago that made her develop a rash, but she has washed everything with her past detergent since. Otherwise, no changes to diet, environmental exposures, no new pets, hast not moved, no known cause.  Did spend some time in the woods last week.  No past medical history on file.  Past Surgical History:  Procedure Laterality Date  . MOUTH SURGERY  2008   wisdom teeth, have re grown    Family History  Problem Relation Age of Onset  . Hypertension Mother   . Heart disease Mother   . Hypertension Maternal Grandfather   . Heart attack Maternal Grandfather   . Breast cancer Paternal Grandmother   . Hypertension Paternal Grandfather   . Diabetes Other   . Fibroids Other     Social History   Socioeconomic History  . Marital status: Single    Spouse name: Not on file  . Number of children: Not on file  . Years of education: Not on file  . Highest education level: Not on file  Occupational History  . Not on file  Tobacco Use  . Smoking status: Never Smoker  . Smokeless tobacco: Never Used  Substance and Sexual Activity  . Alcohol use: Yes    Comment: social  . Drug use: No  . Sexual activity: Yes    Birth control/protection: Pill, Condom  Other Topics Concern  . Not on file  Social History Narrative  . Not on file   Social Determinants of  Health   Financial Resource Strain:   . Difficulty of Paying Living Expenses:   Food Insecurity:   . Worried About Programme researcher, broadcasting/film/video in the Last Year:   . Barista in the Last Year:   Transportation Needs:   . Freight forwarder (Medical):   Marland Kitchen Lack of Transportation (Non-Medical):   Physical Activity:   . Days of Exercise per Week:   . Minutes of Exercise per Session:   Stress:   . Feeling of Stress :   Social Connections:   . Frequency of Communication with Friends and Family:   . Frequency of Social Gatherings with Friends and Family:   . Attends Religious Services:   . Active Member of Clubs or Organizations:   . Attends Banker Meetings:   Marland Kitchen Marital Status:   Intimate Partner Violence:   . Fear of Current or Ex-Partner:   . Emotionally Abused:   Marland Kitchen Physically Abused:   . Sexually Abused:     Outpatient Medications Prior to Visit  Medication Sig Dispense Refill  . doxycycline (VIBRA-TABS) 100 MG tablet Take 1 tablet (100 mg total) by mouth 2 (two) times daily. (Patient not taking: Reported on 11/17/2019) 20 tablet 0   No facility-administered medications prior to visit.  Allergies  Allergen Reactions  . Tramadol Diarrhea and Nausea And Vomiting    Review of Systems  Constitutional: Negative.   HENT: Negative.   Eyes: Negative.   Respiratory: Negative.   Cardiovascular: Negative.   Gastrointestinal: Negative.   Endocrine: Negative.   Genitourinary: Negative.   Musculoskeletal: Negative.   Skin: Negative.   Allergic/Immunologic: Negative.   Neurological: Negative.   Hematological: Negative.   Psychiatric/Behavioral: Negative.   All other systems reviewed and are negative.      Objective:    Physical Exam Vitals and nursing note reviewed.  Constitutional:      General: She is not in acute distress.    Appearance: Normal appearance. She is normal weight. She is not ill-appearing, toxic-appearing or diaphoretic.  Eyes:      General: Lids are everted, no foreign bodies appreciated. Vision grossly intact. Gaze aligned appropriately. Allergic shiner present. No scleral icterus.       Right eye: No discharge.        Left eye: No discharge.     Extraocular Movements: Extraocular movements intact.     Conjunctiva/sclera: Conjunctivae normal.     Pupils: Pupils are equal, round, and reactive to light.     Comments: Notable rash on both upper and lower lids. Red, swollen, warm.  Cardiovascular:     Rate and Rhythm: Normal rate and regular rhythm.  Skin:    General: Skin is warm and dry.     Capillary Refill: Capillary refill takes less than 2 seconds.     Coloration: Skin is not jaundiced or pale.     Findings: Rash (both eyelids) present. No bruising, erythema or lesion.  Neurological:     General: No focal deficit present.     Mental Status: She is alert and oriented to person, place, and time. Mental status is at baseline.     Cranial Nerves: No cranial nerve deficit.  Psychiatric:        Mood and Affect: Mood normal.        Behavior: Behavior normal.        Thought Content: Thought content normal.        Judgment: Judgment normal.     BP 121/85   Pulse 96   Temp 98.4 F (36.9 C) (Temporal)   Resp 16   Ht 5\' 3"  (1.6 m)   Wt 183 lb 12.8 oz (83.4 kg)   LMP 11/15/2019   SpO2 98%   BMI 32.56 kg/m  Wt Readings from Last 3 Encounters:  11/17/19 183 lb 12.8 oz (83.4 kg)  05/05/18 189 lb (85.7 kg)  09/13/16 186 lb (84.4 kg)    Health Maintenance Due  Topic Date Due  . HIV Screening  Never done    There are no preventive care reminders to display for this patient.   Lab Results  Component Value Date   TSH 1.189 01/01/2012   Lab Results  Component Value Date   WBC 11.0 (A) 12/30/2013   HGB 13.1 12/30/2013   HCT 41.7 12/30/2013   MCV 87.0 12/30/2013   PLT 311 09/29/2013   Lab Results  Component Value Date   NA 139 09/29/2013   K 4.3 09/29/2013   CO2 18 (L) 09/29/2013   GLUCOSE 172 (H)  09/29/2013   BUN 15 09/29/2013   CREATININE 0.92 09/29/2013   BILITOT 0.4 09/29/2013   ALKPHOS 85 09/29/2013   AST 17 09/29/2013   ALT 12 09/29/2013   PROT 8.8 (H) 09/29/2013   ALBUMIN 4.5  09/29/2013   CALCIUM 10.5 09/29/2013   No results found for: CHOL No results found for: HDL No results found for: LDLCALC No results found for: TRIG No results found for: CHOLHDL No results found for: HGBA1C     Assessment & Plan:   Problem List Items Addressed This Visit    None    Visit Diagnoses    Rash due to allergy    -  Primary   Relevant Medications   predniSONE (STERAPRED UNI-PAK 21 TAB) 10 MG (21) TBPK tablet   hydrOXYzine (ATARAX/VISTARIL) 25 MG tablet       Meds ordered this encounter  Medications  . predniSONE (STERAPRED UNI-PAK 21 TAB) 10 MG (21) TBPK tablet    Sig: Take per package instructions. Do not skip doses. Finish entire pack.    Dispense:  1 each    Refill:  0    Order Specific Question:   Supervising Provider    Answer:   Delia Chimes A [id number]  . hydrOXYzine (ATARAX/VISTARIL) 25 MG tablet    Sig: Take 1-2 tablets (25-50 mg total) by mouth at bedtime.    Dispense:  30 tablet    Refill:  0    Order Specific Question:   Supervising Provider    Answer:   Forrest Moron [id number]   PLAN  Thin layer of hydrocortisone once daily if needed  Prednisone taper  Hydroxyzine 1-2 tabs qhs   Return precautions given, ER precautions reviewed. Pt demonstrates understanding  Patient encouraged to call clinic with any questions, comments, or concerns.  Maximiano Coss, NP

## 2019-11-24 ENCOUNTER — Other Ambulatory Visit: Payer: Self-pay | Admitting: Registered Nurse

## 2019-11-24 DIAGNOSIS — R21 Rash and other nonspecific skin eruption: Secondary | ICD-10-CM

## 2019-12-09 ENCOUNTER — Ambulatory Visit: Payer: 59 | Admitting: Registered Nurse

## 2019-12-09 ENCOUNTER — Other Ambulatory Visit: Payer: Self-pay

## 2019-12-09 ENCOUNTER — Encounter: Payer: Self-pay | Admitting: Registered Nurse

## 2019-12-09 VITALS — BP 132/84 | HR 80 | Temp 98.0°F | Resp 17 | Ht 63.0 in | Wt 186.0 lb

## 2019-12-09 DIAGNOSIS — L03213 Periorbital cellulitis: Secondary | ICD-10-CM | POA: Diagnosis not present

## 2019-12-09 DIAGNOSIS — Z1329 Encounter for screening for other suspected endocrine disorder: Secondary | ICD-10-CM

## 2019-12-09 DIAGNOSIS — R21 Rash and other nonspecific skin eruption: Secondary | ICD-10-CM

## 2019-12-09 DIAGNOSIS — Z13228 Encounter for screening for other metabolic disorders: Secondary | ICD-10-CM

## 2019-12-09 DIAGNOSIS — Z13220 Encounter for screening for lipoid disorders: Secondary | ICD-10-CM | POA: Diagnosis not present

## 2019-12-09 DIAGNOSIS — Z130 Encounter for screening for diseases of the blood and blood-forming organs and certain disorders involving the immune mechanism: Secondary | ICD-10-CM

## 2019-12-09 DIAGNOSIS — T7840XA Allergy, unspecified, initial encounter: Secondary | ICD-10-CM | POA: Diagnosis not present

## 2019-12-09 MED ORDER — PREDNISONE 10 MG PO TABS: ORAL_TABLET | ORAL | 0 refills | Status: AC

## 2019-12-09 MED ORDER — SULFAMETHOXAZOLE-TRIMETHOPRIM 800-160 MG PO TABS: 1.0000 | ORAL_TABLET | Freq: Two times a day (BID) | ORAL | 0 refills | Status: DC

## 2019-12-09 MED ORDER — AMOXICILLIN-POT CLAVULANATE 875-125 MG PO TABS: 1.0000 | ORAL_TABLET | Freq: Two times a day (BID) | ORAL | 0 refills | Status: DC

## 2019-12-09 NOTE — Patient Instructions (Signed)
° ° ° °  If you have lab work done today you will be contacted with your lab results within the next 2 weeks.  If you have not heard from us then please contact us. The fastest way to get your results is to register for My Chart. ° ° °IF you received an x-ray today, you will receive an invoice from [facility]. Please contact [facility] at [phone] with questions or concerns regarding your invoice.  ° °IF you received labwork today, you will receive an invoice from [facility]. Please contact [facility] at [phone] with questions or concerns regarding your invoice.  ° °Our billing staff will not be able to assist you with questions regarding bills from these companies. ° °You will be contacted with the lab results as soon as they are available. The fastest way to get your results is to activate your My Chart account. Instructions are located on the last page of this paperwork. If you have not heard from us regarding the results in 2 weeks, please contact this office. °  ° ° ° °

## 2019-12-09 NOTE — Progress Notes (Signed)
Established Patient Office Visit  Subjective:  Patient ID: Meghan Mosley, female    DOB: [birth year]  Age: 34 y.o. MRN: [id number]  CC:  Chief Complaint  Patient presents with  . Allergies    patient states she was here on 5/4 due to rash around eyes and she used the prescription that was sent to the pharmacy and noticed a little patch left now its back and worse. Patient states she didnt receive the Doxycycline and wonder if thats why it returned quickly     HPI Meghan Mosley presents for recurrence of rash  Briefly - last visit was presented with periorbital rash that was red, itchy, tender. Had some crusting around eyes. [place] symptoms mimicked previous allergy symptoms but much worse. Gave prednisone taper, suggest avoiding any new hygiene products, hydroxyzine for nigthttime return precautions.   With prednisone and hydroxyzine, symptoms improved drastically, near resolution. However, following this course, they have returned and are worse, now with less itching and a greater feeling of inflammation around the area, including burning with any application of products or even warm wash cloth. Rash is limited to periorbital area, no malar pattern, no symptoms suggestive of autoimmunity.  Of note - pt recently started a job in wine distribution, drinking much more wine than she had ever drank before. No known allergy to sulfites or any other wine ingredients, but she had stopped drinking for the duration of her steroid taper when symptoms had originally resolved. Resumed drinking when symptoms returned.   No past medical history on file.  Past Surgical History:  Procedure Laterality Date  . MOUTH SURGERY  2008   wisdom teeth, have re grown    Family History  Problem Relation Age of Onset  . Hypertension Mother   . Heart disease Mother   . Hypertension Maternal Grandfather   . Heart attack Maternal Grandfather   . Breast cancer Paternal Grandmother    . Hypertension Paternal Grandfather   . Diabetes Other   . Fibroids Other     Social History   Socioeconomic History  . Marital status: Single    Spouse name: Not on file  . Number of children: Not on file  . Years of education: Not on file  . Highest education level: Not on file  Occupational History  . Not on file  Tobacco Use  . Smoking status: Never Smoker  . Smokeless tobacco: Never Used  Substance and Sexual Activity  . Alcohol use: Yes    Comment: social  . Drug use: No  . Sexual activity: Yes    Birth control/protection: Pill, Condom  Other Topics Concern  . Not on file  Social History Narrative  . Not on file   Social Determinants of Health   Financial Resource Strain:   . Difficulty of Paying Living Expenses:   Food Insecurity:   . Worried About Programme researcher, broadcasting/film/video in the Last Year:   . Barista in the Last Year:   Transportation Needs:   . Freight forwarder (Medical):   Marland Kitchen Lack of Transportation (Non-Medical):   Physical Activity:   . Days of Exercise per Week:   . Minutes of Exercise per Session:   Stress:   . Feeling of Stress :   Social Connections:   . Frequency of Communication with Friends and Family:   . Frequency of Social Gatherings with Friends and Family:   . Attends Religious Services:   .  Active Member of Clubs or Organizations:   . Attends Banker Meetings:   Marland Kitchen Marital Status:   Intimate Partner Violence:   . Fear of Current or Ex-Partner:   . Emotionally Abused:   Marland Kitchen Physically Abused:   . Sexually Abused:     Outpatient Medications Prior to Visit  Medication Sig Dispense Refill  . hydrOXYzine (ATARAX/VISTARIL) 25 MG tablet TAKE 1 TO 2 TABLETS BY MOUTH AT BEDTIME 180 tablet 1  . predniSONE (STERAPRED UNI-PAK 21 TAB) 10 MG (21) TBPK tablet Take per package instructions. Do not skip doses. Finish entire pack. 1 each 0  . doxycycline (VIBRA-TABS) 100 MG tablet Take 1 tablet (100 mg total) by mouth 2 (two)  times daily. (Patient not taking: Reported on 11/17/2019) 20 tablet 0   No facility-administered medications prior to visit.    Allergies  Allergen Reactions  . Tramadol Diarrhea and Nausea And Vomiting    ROS Review of Systems  Constitutional: Negative.   HENT: Negative.   Eyes: Negative.   Respiratory: Negative.   Cardiovascular: Negative.   Gastrointestinal: Negative.   Endocrine: Negative.   Genitourinary: Negative.   Musculoskeletal: Negative.   Skin: Positive for rash. Negative for color change, pallor and wound.  Allergic/Immunologic: Negative.   Neurological: Negative.   Hematological: Negative.   Psychiatric/Behavioral: Negative.   All other systems reviewed and are negative.     Objective:    Physical Exam  Constitutional: She is oriented to person, place, and time. She appears well-developed and well-nourished. No distress.  Cardiovascular: Normal rate and regular rhythm.  Pulmonary/Chest: Effort normal. No respiratory distress.  Neurological: She is alert and oriented to person, place, and time.  Skin: Skin is warm and dry. Rash noted. She is not diaphoretic. No erythema. No pallor.  Psychiatric: She has a normal mood and affect. Her behavior is normal. Judgment and thought content normal.  Nursing note and vitals reviewed.   BP 132/84   Pulse 80   Temp 98 F (36.7 C) (Temporal)   Resp 17   Ht 5\' 3"  (1.6 m)   Wt 186 lb (84.4 kg)   LMP 11/15/2019   SpO2 97%   BMI 32.95 kg/m  Wt Readings from Last 3 Encounters:  12/09/19 186 lb (84.4 kg)  11/17/19 183 lb 12.8 oz (83.4 kg)  05/05/18 189 lb (85.7 kg)     Health Maintenance Due  Topic Date Due  . HIV Screening  Never done    There are no preventive care reminders to display for this patient.  Lab Results  Component Value Date   TSH 1.189 01/01/2012   Lab Results  Component Value Date   WBC 11.0 (A) 12/30/2013   HGB 13.1 12/30/2013   HCT 41.7 12/30/2013   MCV 87.0 12/30/2013   PLT 311  09/29/2013   Lab Results  Component Value Date   NA 139 09/29/2013   K 4.3 09/29/2013   CO2 18 (L) 09/29/2013   GLUCOSE 172 (H) 09/29/2013   BUN 15 09/29/2013   CREATININE 0.92 09/29/2013   BILITOT 0.4 09/29/2013   ALKPHOS 85 09/29/2013   AST 17 09/29/2013   ALT 12 09/29/2013   PROT 8.8 (H) 09/29/2013   ALBUMIN 4.5 09/29/2013   CALCIUM 10.5 09/29/2013   No results found for: CHOL No results found for: HDL No results found for: LDLCALC No results found for: TRIG No results found for: CHOLHDL No results found for: 10/01/2013    Assessment & Plan:  Problem List Items Addressed This Visit    None    Visit Diagnoses    Screening for endocrine, metabolic and immunity disorder    -  Primary   Relevant Orders   CBC With Differential   Comprehensive metabolic panel   TSH   Hemoglobin A1c   Lipid screening       Relevant Orders   Lipid panel   Rash due to allergy       Relevant Medications   predniSONE (DELTASONE) 10 MG tablet   Preseptal cellulitis       Relevant Medications   amoxicillin-clavulanate (AUGMENTIN) 875-125 MG tablet   sulfamethoxazole-trimethoprim (BACTRIM DS) 800-160 MG tablet      Meds ordered this encounter  Medications  . predniSONE (DELTASONE) 10 MG tablet    Sig: Take 4 tablets (40 mg total) by mouth daily with breakfast for 3 days, THEN 2 tablets (20 mg total) daily with breakfast for 3 days, THEN 1 tablet (10 mg total) daily with breakfast for 3 days.    Dispense:  21 tablet    Refill:  0    Order Specific Question:   Supervising Provider    Answer:   Delia Chimes A [id number]  . amoxicillin-clavulanate (AUGMENTIN) 875-125 MG tablet    Sig: Take 1 tablet by mouth 2 (two) times daily.    Dispense:  20 tablet    Refill:  0    Order Specific Question:   Supervising Provider    Answer:   Delia Chimes A [id number]  . sulfamethoxazole-trimethoprim (BACTRIM DS) 800-160 MG tablet    Sig: Take 1 tablet by mouth 2 (two) times daily.    Dispense:  6  tablet    Refill:  0    Order Specific Question:   Supervising Provider    Answer:   Forrest Moron [id number]    Follow-up: No follow-ups on file.   PLAN  Longer prednisone taper.  abx in case of preseptal cellulitis  Refer to allergy and derm  Return precautions and hospital precautions reviewed  Pt to return in 3-4 weeks for visit to establish care.  Patient encouraged to call clinic with any questions, comments, or concerns.  Maximiano Coss, NP

## 2019-12-10 LAB — COMPREHENSIVE METABOLIC PANEL
ALT: 23 IU/L (ref 0–32)
AST: 38 IU/L (ref 0–40)
Albumin/Globulin Ratio: 1.6 (ref 1.2–2.2)
Albumin: 4.3 g/dL (ref 3.8–4.8)
Alkaline Phosphatase: 83 IU/L (ref 48–121)
BUN/Creatinine Ratio: 10 (ref 9–23)
BUN: 9 mg/dL (ref 6–20)
Bilirubin Total: 0.3 mg/dL (ref 0.0–1.2)
CO2: 20 mmol/L (ref 20–29)
Calcium: 9.3 mg/dL (ref 8.7–10.2)
Chloride: 106 mmol/L (ref 96–106)
Creatinine, Ser: 0.86 mg/dL (ref 0.57–1.00)
GFR calc Af Amer: 104 mL/min/{1.73_m2} (ref >59)
GFR calc non Af Amer: 90 mL/min/{1.73_m2} (ref >59)
Globulin, Total: 2.7 g/dL (ref 1.5–4.5)
Glucose: 82 mg/dL (ref 65–99)
Potassium: 4.8 mmol/L (ref 3.5–5.2)
Sodium: 140 mmol/L (ref 134–144)
Total Protein: 7 g/dL (ref 6.0–8.5)

## 2019-12-10 LAB — CBC WITH DIFFERENTIAL
Basophils Absolute: 0 10*3/uL (ref 0.0–0.2)
Basos: 0 %
EOS (ABSOLUTE): 0.1 10*3/uL (ref 0.0–0.4)
Eos: 1 %
Hematocrit: 41.3 % (ref 34.0–46.6)
Hemoglobin: 13.5 g/dL (ref 11.1–15.9)
Immature Grans (Abs): 0 10*3/uL (ref 0.0–0.1)
Immature Granulocytes: 0 %
Lymphocytes Absolute: 1.7 10*3/uL (ref 0.7–3.1)
Lymphs: 25 %
MCH: 28.4 pg (ref 26.6–33.0)
MCHC: 32.7 g/dL (ref 31.5–35.7)
MCV: 87 fL (ref 79–97)
Monocytes Absolute: 0.5 10*3/uL (ref 0.1–0.9)
Monocytes: 8 %
Neutrophils Absolute: 4.5 10*3/uL (ref 1.4–7.0)
Neutrophils: 66 %
RBC: 4.75 x10E6/uL (ref 3.77–5.28)
RDW: 13.4 % (ref 11.7–15.4)
WBC: 6.8 10*3/uL (ref 3.4–10.8)

## 2019-12-10 LAB — HEMOGLOBIN A1C
Est. average glucose Bld gHb Est-mCnc: 108 mg/dL
Hgb A1c MFr Bld: 5.4 % (ref 4.8–5.6)

## 2019-12-10 LAB — LIPID PANEL
Chol/HDL Ratio: 3.4 ratio (ref 0.0–4.4)
Cholesterol, Total: 168 mg/dL (ref 100–199)
HDL: 49 mg/dL (ref >39)
LDL Chol Calc (NIH): 106 mg/dL — ABNORMAL HIGH (ref 0–99)
Triglycerides: 69 mg/dL (ref 0–149)
VLDL Cholesterol Cal: 13 mg/dL (ref 5–40)

## 2019-12-10 LAB — TSH: TSH: 1.15 u[IU]/mL (ref 0.450–4.500)

## 2019-12-31 ENCOUNTER — Encounter: Payer: Self-pay | Admitting: Registered Nurse

## 2020-01-05 ENCOUNTER — Ambulatory Visit: Payer: 59 | Admitting: Registered Nurse

## 2020-01-05 ENCOUNTER — Encounter: Payer: Self-pay | Admitting: Registered Nurse

## 2020-01-05 ENCOUNTER — Other Ambulatory Visit: Payer: Self-pay

## 2020-01-05 VITALS — BP 130/86 | HR 83 | Temp 98.6°F | Resp 17 | Ht 63.0 in | Wt 187.2 lb

## 2020-01-05 DIAGNOSIS — F411 Generalized anxiety disorder: Secondary | ICD-10-CM | POA: Diagnosis not present

## 2020-01-05 DIAGNOSIS — Z7689 Persons encountering health services in other specified circumstances: Secondary | ICD-10-CM

## 2020-01-05 DIAGNOSIS — Z0001 Encounter for general adult medical examination with abnormal findings: Secondary | ICD-10-CM

## 2020-01-05 DIAGNOSIS — Z0000 Encounter for general adult medical examination without abnormal findings: Secondary | ICD-10-CM

## 2020-01-05 MED ORDER — FLUOXETINE HCL 20 MG PO TABS: 20.0000 mg | ORAL_TABLET | Freq: Every day | ORAL | 0 refills | Status: DC

## 2020-01-05 NOTE — Patient Instructions (Signed)
° ° ° °  If you have lab work done today you will be contacted with your lab results within the next 2 weeks.  If you have not heard from us then please contact us. The fastest way to get your results is to register for My Chart. ° ° °IF you received an x-ray today, you will receive an invoice from [facility]. Please contact [facility] at [phone] with questions or concerns regarding your invoice.  ° °IF you received labwork today, you will receive an invoice from [facility]. Please contact [facility] at [phone] with questions or concerns regarding your invoice.  ° °Our billing staff will not be able to assist you with questions regarding bills from these companies. ° °You will be contacted with the lab results as soon as they are available. The fastest way to get your results is to activate your My Chart account. Instructions are located on the last page of this paperwork. If you have not heard from us regarding the results in 2 weeks, please contact this office. °  ° ° ° °

## 2020-01-05 NOTE — Progress Notes (Signed)
Established Patient Office Visit  Subjective:  Patient ID: Meghan Mosley, female    DOB: Jun 25, 1986  Age: 34 y.o. MRN: [id number]  CC:  Chief Complaint  Patient presents with   Transitions Of Care    Patient needs a physical and would like to discuss a weird feeling in her chest thats been going on for over a month . Per patient it feels like food is stuck and anxiety.   Anxiety    GAD7=12    HPI Hallel Denherder presents for TOC, CPE, and anxiety  Anxiety: has been a building issue for years. Noted that when she was given hydroxyzine for itching rash, she felt her anxiety got a lot better, then noticed how bad it was at baseline. Has contacted a therapist's office and will start counseling in 2 weeks. Interested in medication. Strong family history of anxiety. Notes globus sensation at end of long days that she feels may be r/t anxiety.  Otherwise without concern  No past medical history on file.  Past Surgical History:  Procedure Laterality Date   MOUTH SURGERY  2008   wisdom teeth, have re grown    Family History  Problem Relation Age of Onset   Hypertension Mother    Heart disease Mother    Hypertension Maternal Grandfather    Heart attack Maternal Grandfather    Breast cancer Paternal Grandmother    Hypertension Paternal Grandfather    Diabetes Other    Fibroids Other     Social History   Socioeconomic History   Marital status: Single    Spouse name: Not on file   Number of children: Not on file   Years of education: Not on file   Highest education level: Not on file  Occupational History   Not on file  Tobacco Use   Smoking status: Never Smoker   Smokeless tobacco: Never Used  Substance and Sexual Activity   Alcohol use: Yes    Comment: social   Drug use: No   Sexual activity: Yes    Birth control/protection: Pill, Condom  Other Topics Concern   Not on file  Social History Narrative   Not on file   Social Determinants of  Health   Financial Resource Strain:    Difficulty of Paying Living Expenses:   Food Insecurity:    Worried About Programme researcher, broadcasting/film/video in the Last Year:    Barista in the Last Year:   Transportation Needs:    Freight forwarder (Medical):    Lack of Transportation (Non-Medical):   Physical Activity:    Days of Exercise per Week:    Minutes of Exercise per Session:   Stress:    Feeling of Stress :   Social Connections:    Frequency of Communication with Friends and Family:    Frequency of Social Gatherings with Friends and Family:    Attends Religious Services:    Active Member of Clubs or Organizations:    Attends Engineer, structural:    Marital Status:   Intimate Partner Violence:    Fear of Current or Ex-Partner:    Emotionally Abused:    Physically Abused:    Sexually Abused:     Outpatient Medications Prior to Visit  Medication Sig Dispense Refill   amoxicillin-clavulanate (AUGMENTIN) 875-125 MG tablet Take 1 tablet by mouth 2 (two) times daily. 20 tablet 0   sulfamethoxazole-trimethoprim (BACTRIM DS) 800-160 MG tablet Take 1 tablet by mouth 2 (two) times daily.  6 tablet 0   hydrOXYzine (ATARAX/VISTARIL) 25 MG tablet TAKE 1 TO 2 TABLETS BY MOUTH AT BEDTIME 180 tablet 1   No facility-administered medications prior to visit.    Allergies  Allergen Reactions   Tramadol Diarrhea and Nausea And Vomiting    ROS Review of Systems  Constitutional: Negative.   HENT: Negative.   Eyes: Negative.   Respiratory: Negative.   Cardiovascular: Negative.   Gastrointestinal: Negative.   Endocrine: Negative.   Genitourinary: Negative.   Musculoskeletal: Negative.   Skin: Negative.   Allergic/Immunologic: Negative.   Neurological: Negative.   Hematological: Negative.   Psychiatric/Behavioral: Negative.   All other systems reviewed and are negative.     Objective:    Physical Exam Vitals and nursing note reviewed.   Constitutional:      General: She is not in acute distress.    Appearance: Normal appearance. She is normal weight. She is not ill-appearing, toxic-appearing or diaphoretic.  HENT:     Head: Normocephalic and atraumatic.     Right Ear: Tympanic membrane, ear canal and external ear normal. There is no impacted cerumen.     Left Ear: Tympanic membrane, ear canal and external ear normal. There is no impacted cerumen.     Nose: Nose normal. No congestion or rhinorrhea.     Mouth/Throat:     Mouth: Mucous membranes are moist.     Pharynx: Oropharynx is clear. No oropharyngeal exudate or posterior oropharyngeal erythema.  Eyes:     General: No scleral icterus.       Right eye: No discharge.        Left eye: No discharge.     Extraocular Movements: Extraocular movements intact.     Conjunctiva/sclera: Conjunctivae normal.     Pupils: Pupils are equal, round, and reactive to light.  Cardiovascular:     Rate and Rhythm: Normal rate and regular rhythm.     Pulses: Normal pulses.     Heart sounds: Normal heart sounds. No murmur heard.  No friction rub. No gallop.   Pulmonary:     Effort: Pulmonary effort is normal. No respiratory distress.     Breath sounds: Normal breath sounds. No stridor. No wheezing, rhonchi or rales.  Chest:     Chest wall: No tenderness.  Abdominal:     General: Abdomen is flat. Bowel sounds are normal. There is no distension.     Palpations: Abdomen is soft. There is no mass.     Tenderness: There is no abdominal tenderness. There is no right CVA tenderness, left CVA tenderness, guarding or rebound.     Hernia: No hernia is present.  Musculoskeletal:        General: No swelling, tenderness, deformity or signs of injury. Normal range of motion.     Right lower leg: No edema.     Left lower leg: No edema.  Skin:    General: Skin is warm and dry.     Capillary Refill: Capillary refill takes less than 2 seconds.     Coloration: Skin is not jaundiced or pale.      Findings: No bruising, erythema, lesion or rash.  Neurological:     General: No focal deficit present.     Mental Status: She is alert and oriented to person, place, and time. Mental status is at baseline.     Cranial Nerves: No cranial nerve deficit.     Sensory: No sensory deficit.     Motor: No weakness.  Coordination: Coordination normal.     Gait: Gait normal.     Deep Tendon Reflexes: Reflexes normal.  Psychiatric:        Mood and Affect: Mood normal.        Behavior: Behavior normal.        Thought Content: Thought content normal.        Judgment: Judgment normal.     BP 130/86    Pulse 83    Temp 98.6 F (37 C) (Temporal)    Resp 17    Ht 5\' 3"  (1.6 m)    Wt 187 lb 3.2 oz (84.9 kg)    LMP 12/11/2019    SpO2 100%    BMI 33.16 kg/m  Wt Readings from Last 3 Encounters:  01/05/20 187 lb 3.2 oz (84.9 kg)  12/09/19 186 lb (84.4 kg)  11/17/19 183 lb 12.8 oz (83.4 kg)     There are no preventive care reminders to display for this patient.  There are no preventive care reminders to display for this patient.  Lab Results  Component Value Date   TSH 1.150 12/09/2019   Lab Results  Component Value Date   WBC 6.8 12/09/2019   HGB 13.5 12/09/2019   HCT 41.3 12/09/2019   MCV 87 12/09/2019   PLT 311 09/29/2013   Lab Results  Component Value Date   NA 140 12/09/2019   K 4.8 12/09/2019   CO2 20 12/09/2019   GLUCOSE 82 12/09/2019   BUN 9 12/09/2019   CREATININE 0.86 12/09/2019   BILITOT 0.3 12/09/2019   ALKPHOS 83 12/09/2019   AST 38 12/09/2019   ALT 23 12/09/2019   PROT 7.0 12/09/2019   ALBUMIN 4.3 12/09/2019   CALCIUM 9.3 12/09/2019   Lab Results  Component Value Date   CHOL 168 12/09/2019   Lab Results  Component Value Date   HDL 49 12/09/2019   Lab Results  Component Value Date   LDLCALC 106 (H) 12/09/2019   Lab Results  Component Value Date   TRIG 69 12/09/2019   Lab Results  Component Value Date   CHOLHDL 3.4 12/09/2019   Lab Results   Component Value Date   HGBA1C 5.4 12/09/2019      Assessment & Plan:   Problem List Items Addressed This Visit    None    Visit Diagnoses    GAD (generalized anxiety disorder)    -  Primary   Relevant Medications   FLUoxetine (PROZAC) 20 MG tablet   Encounter to establish care       Annual physical exam          Meds ordered this encounter  Medications   FLUoxetine (PROZAC) 20 MG tablet    Sig: Take 1 tablet (20 mg total) by mouth daily before breakfast.    Dispense:  90 tablet    Refill:  0    Order Specific Question:   Supervising Provider    Answer:   12/11/2019 Georgina Quint    Follow-up: No follow-ups on file.   PLAN  Start fluoxetine 20mg  PO qd. May increase to 40mg  PO qd after 2 weeks  Return in 4-6 weeks for med check  Otherwise exam wnl, labs reviewed, no concerns  Return in 1 year for cpe  Patient encouraged to call clinic with any questions, comments, or concerns.  [[id number]], NP

## 2020-02-09 ENCOUNTER — Ambulatory Visit: Payer: 59 | Admitting: Allergy & Immunology

## 2020-03-27 ENCOUNTER — Other Ambulatory Visit: Payer: Self-pay | Admitting: Registered Nurse

## 2020-03-27 DIAGNOSIS — F411 Generalized anxiety disorder: Secondary | ICD-10-CM

## 2020-03-28 NOTE — Telephone Encounter (Signed)
Curtesy refill sent in for pt's Prozac. 

## 2020-07-19 DIAGNOSIS — Z6831 Body mass index (BMI) 31.0-31.9, adult: Secondary | ICD-10-CM | POA: Diagnosis not present

## 2020-07-19 DIAGNOSIS — R8290 Unspecified abnormal findings in urine: Secondary | ICD-10-CM | POA: Diagnosis not present

## 2020-07-19 DIAGNOSIS — Z01419 Encounter for gynecological examination (general) (routine) without abnormal findings: Secondary | ICD-10-CM | POA: Diagnosis not present

## 2020-08-02 ENCOUNTER — Encounter: Payer: Self-pay | Admitting: Registered Nurse

## 2020-08-02 ENCOUNTER — Other Ambulatory Visit: Payer: Self-pay

## 2020-08-02 ENCOUNTER — Telehealth (INDEPENDENT_AMBULATORY_CARE_PROVIDER_SITE_OTHER): Payer: BC Managed Care – PPO | Admitting: Registered Nurse

## 2020-08-02 VITALS — Temp 99.9°F

## 2020-08-02 DIAGNOSIS — U071 COVID-19: Secondary | ICD-10-CM | POA: Diagnosis not present

## 2020-08-02 MED ORDER — BENZONATATE 200 MG PO CAPS: 200.0000 mg | ORAL_CAPSULE | Freq: Two times a day (BID) | ORAL | 0 refills | Status: DC | PRN

## 2020-08-02 MED ORDER — PREDNISONE 20 MG PO TABS
40.0000 mg | ORAL_TABLET | Freq: Every day | ORAL | 0 refills | Status: DC
Start: 2020-08-02 — End: 2021-06-25

## 2020-08-02 MED ORDER — AZELASTINE HCL 0.1 % NA SOLN: 2.0000 | Freq: Two times a day (BID) | NASAL | 12 refills | Status: DC

## 2020-08-02 NOTE — Progress Notes (Signed)
Telemedicine Encounter- SOAP NOTE Established Patient  This telephone encounter was conducted with the patient's (or proxy's) verbal consent via audio telecommunications: yes  Patient was instructed to have this encounter in a suitably private space; and to only have persons present to whom they give permission to participate. In addition, patient identity was confirmed by use of name plus two identifiers (DOB and address).  I discussed the limitations, risks, security and privacy concerns of performing an evaluation and management service by telephone and the availability of in person appointments. I also discussed with the patient that there may be a patient responsible charge related to this service. The patient expressed understanding and agreed to proceed.  I spent a total of 14 mintues talking with the patient or their proxy.  Patient at home Provider in office  Chief Complaint  Patient presents with  . Covid Positive    Patient states she took a at home covid test today after feeling bad yesterday and it was positive. She has been having a sore throat , coughing, chest congestion, and 99.9 temperature. She has been taking otc medications that helps a little bit.    Subjective   Meghan Mosley is a 35 y.o. established patient. Telephone visit today for covid  HPI Felt symptoms onset 2 days ago Took home test - positive Husband is also positive She is having cough, sore throat, nasal congestion, some diarrhea, and some fever Cough sore throat and congestion continue Diarrhea has not occurred so far today, but 3 instances yesterday Fever has improved, temp is at 99.4f Has been taking OTCs, good effect, but not total resolution rec'd both doses of vaccine but has not rec'd booster yet.  There are no problems to display for this patient.   No past medical history on file.  Current Outpatient Medications  Medication Sig Dispense Refill  . azelastine (ASTELIN) 0.1 % nasal  spray Place 2 sprays into both nostrils 2 (two) times daily. Use in each nostril as directed 30 mL 12  . benzonatate (TESSALON) 200 MG capsule Take 1 capsule (200 mg total) by mouth 2 (two) times daily as needed for cough. 20 capsule 0  . FLUoxetine (PROZAC) 20 MG tablet TAKE 1 TABLET (20 MG TOTAL) BY MOUTH DAILY BEFORE BREAKFAST. 90 tablet 0  . predniSONE (DELTASONE) 20 MG tablet Take 2 tablets (40 mg total) by mouth daily with breakfast. 8 tablet 0   No current facility-administered medications for this visit.    Allergies  Allergen Reactions  . Tramadol Diarrhea and Nausea And Vomiting    Social History   Socioeconomic History  . Marital status: Single    Spouse name: Not on file  . Number of children: Not on file  . Years of education: Not on file  . Highest education level: Not on file  Occupational History  . Not on file  Tobacco Use  . Smoking status: Never Smoker  . Smokeless tobacco: Never Used  Substance and Sexual Activity  . Alcohol use: Yes    Comment: social  . Drug use: No  . Sexual activity: Yes    Birth control/protection: Pill, Condom  Other Topics Concern  . Not on file  Social History Narrative  . Not on file   Social Determinants of Health   Financial Resource Strain: Not on file  Food Insecurity: Not on file  Transportation Needs: Not on file  Physical Activity: Not on file  Stress: Not on file  Social Connections: Not on  file  Intimate Partner Violence: Not on file    Review of Systems  Constitutional: Positive for fever and malaise/fatigue.  HENT: Negative.   Eyes: Negative.   Respiratory: Positive for cough, sputum production and shortness of breath.   Cardiovascular: Negative.   Gastrointestinal: Positive for diarrhea.  Genitourinary: Negative.   Musculoskeletal: Positive for myalgias.  Skin: Negative.   Neurological: Negative.   Endo/Heme/Allergies: Negative.   Psychiatric/Behavioral: Negative.   All other systems reviewed and  are negative.   Objective   Vitals as reported by the patient: Today's Vitals   08/02/20 1353  Temp: 99.9 F (37.7 C)    Hazell was seen today for covid positive.  Diagnoses and all orders for this visit:  COVID-19 -     predniSONE (DELTASONE) 20 MG tablet; Take 2 tablets (40 mg total) by mouth daily with breakfast. -     azelastine (ASTELIN) 0.1 % nasal spray; Place 2 sprays into both nostrils 2 (two) times daily. Use in each nostril as directed -     benzonatate (TESSALON) 200 MG capsule; Take 1 capsule (200 mg total) by mouth 2 (two) times daily as needed for cough.   PLAN  Sounds as typical case of COVID  Prednisone burst, azelastine, and tessalon  ER precautions reviewed  Typical course of illness reviewed  Patient encouraged to call clinic with any questions, comments, or concerns.   I discussed the assessment and treatment plan with the patient. The patient was provided an opportunity to ask questions and all were answered. The patient agreed with the plan and demonstrated an understanding of the instructions.   The patient was advised to call back or seek an in-person evaluation if the symptoms worsen or if the condition fails to improve as anticipated.  I provided 14 minutes of non-face-to-face time during this encounter.  Janeece Agee, NP  Primary Care at [facility]

## 2020-08-02 NOTE — Patient Instructions (Signed)
° ° ° °  If you have lab work done today you will be contacted with your lab results within the next 2 weeks.  If you have not heard from us then please contact us. The fastest way to get your results is to register for My Chart. ° ° °IF you received an x-ray today, you will receive an invoice from [facility]. Please contact [facility] at [phone] with questions or concerns regarding your invoice.  ° °IF you received labwork today, you will receive an invoice from [facility]. Please contact [facility] at [phone] with questions or concerns regarding your invoice.  ° °Our billing staff will not be able to assist you with questions regarding bills from these companies. ° °You will be contacted with the lab results as soon as they are available. The fastest way to get your results is to activate your My Chart account. Instructions are located on the last page of this paperwork. If you have not heard from us regarding the results in 2 weeks, please contact this office. °  ° ° ° °

## 2020-08-08 ENCOUNTER — Encounter: Payer: Self-pay | Admitting: Registered Nurse

## 2020-11-07 DIAGNOSIS — N911 Secondary amenorrhea: Secondary | ICD-10-CM | POA: Diagnosis not present

## 2020-11-15 DIAGNOSIS — Z3A08 8 weeks gestation of pregnancy: Secondary | ICD-10-CM | POA: Diagnosis not present

## 2020-11-15 DIAGNOSIS — Z31430 Encounter of female for testing for genetic disease carrier status for procreative management: Secondary | ICD-10-CM | POA: Diagnosis not present

## 2020-11-15 DIAGNOSIS — Z3481 Encounter for supervision of other normal pregnancy, first trimester: Secondary | ICD-10-CM | POA: Diagnosis not present

## 2020-11-15 DIAGNOSIS — Z3482 Encounter for supervision of other normal pregnancy, second trimester: Secondary | ICD-10-CM | POA: Diagnosis not present

## 2020-11-15 DIAGNOSIS — Z3A10 10 weeks gestation of pregnancy: Secondary | ICD-10-CM | POA: Diagnosis not present

## 2020-11-15 DIAGNOSIS — Z3A14 14 weeks gestation of pregnancy: Secondary | ICD-10-CM | POA: Diagnosis not present

## 2020-11-15 DIAGNOSIS — Z3685 Encounter for antenatal screening for Streptococcus B: Secondary | ICD-10-CM | POA: Diagnosis not present

## 2020-11-15 LAB — HEPATITIS C ANTIBODY: HCV Ab: NEGATIVE

## 2020-11-15 LAB — OB RESULTS CONSOLE HEPATITIS B SURFACE ANTIGEN: Hepatitis B Surface Ag: NEGATIVE

## 2020-11-15 LAB — OB RESULTS CONSOLE ABO/RH: RH Type: POSITIVE

## 2020-11-15 LAB — OB RESULTS CONSOLE RUBELLA ANTIBODY, IGM: Rubella: IMMUNE

## 2020-11-15 LAB — OB RESULTS CONSOLE RPR: RPR: NONREACTIVE

## 2020-11-15 LAB — OB RESULTS CONSOLE ANTIBODY SCREEN: Antibody Screen: NEGATIVE

## 2020-11-15 LAB — OB RESULTS CONSOLE HIV ANTIBODY (ROUTINE TESTING): HIV: NONREACTIVE

## 2020-11-28 DIAGNOSIS — Z3A10 10 weeks gestation of pregnancy: Secondary | ICD-10-CM | POA: Diagnosis not present

## 2020-11-28 DIAGNOSIS — Z113 Encounter for screening for infections with a predominantly sexual mode of transmission: Secondary | ICD-10-CM | POA: Diagnosis not present

## 2020-11-28 DIAGNOSIS — Z124 Encounter for screening for malignant neoplasm of cervix: Secondary | ICD-10-CM | POA: Diagnosis not present

## 2020-11-28 DIAGNOSIS — Z3481 Encounter for supervision of other normal pregnancy, first trimester: Secondary | ICD-10-CM | POA: Diagnosis not present

## 2020-11-28 LAB — HM PAP SMEAR: HM Pap smear: NEGATIVE

## 2020-11-28 LAB — OB RESULTS CONSOLE GC/CHLAMYDIA: Gonorrhea: NEGATIVE

## 2020-12-19 DIAGNOSIS — Z3481 Encounter for supervision of other normal pregnancy, first trimester: Secondary | ICD-10-CM | POA: Diagnosis not present

## 2020-12-19 DIAGNOSIS — Z3682 Encounter for antenatal screening for nuchal translucency: Secondary | ICD-10-CM | POA: Diagnosis not present

## 2020-12-19 DIAGNOSIS — Z3A13 13 weeks gestation of pregnancy: Secondary | ICD-10-CM | POA: Diagnosis not present

## 2020-12-28 DIAGNOSIS — Z3481 Encounter for supervision of other normal pregnancy, first trimester: Secondary | ICD-10-CM | POA: Diagnosis not present

## 2020-12-28 DIAGNOSIS — Z3482 Encounter for supervision of other normal pregnancy, second trimester: Secondary | ICD-10-CM | POA: Diagnosis not present

## 2020-12-28 DIAGNOSIS — Z3A14 14 weeks gestation of pregnancy: Secondary | ICD-10-CM | POA: Diagnosis not present

## 2021-04-06 ENCOUNTER — Ambulatory Visit (INDEPENDENT_AMBULATORY_CARE_PROVIDER_SITE_OTHER): Payer: Self-pay | Admitting: Pediatrics

## 2021-04-06 ENCOUNTER — Other Ambulatory Visit: Payer: Self-pay

## 2021-04-06 ENCOUNTER — Encounter: Payer: Self-pay | Admitting: Pediatrics

## 2021-04-06 DIAGNOSIS — Z7681 Expectant parent(s) prebirth pediatrician visit: Secondary | ICD-10-CM

## 2021-04-06 HISTORY — DX: Expectant parent(s) prebirth pediatrician visit: Z76.81

## 2021-04-06 NOTE — Progress Notes (Signed)
Prenatal counseling for impending newborn done. Discussed vaccine policy, mother agrees with vaccine policy. Mother carrier for CF, father's genetic screening negative.  Z76.81

## 2021-06-02 LAB — OB RESULTS CONSOLE GBS: GBS: POSITIVE

## 2021-06-07 LAB — OB RESULTS CONSOLE GBS: GBS: POSITIVE

## 2021-06-23 ENCOUNTER — Inpatient Hospital Stay (HOSPITAL_COMMUNITY): Payer: 59 | Admitting: Anesthesiology

## 2021-06-23 ENCOUNTER — Inpatient Hospital Stay (HOSPITAL_COMMUNITY)
Admission: AD | Admit: 2021-06-23 | Discharge: 2021-06-25 | DRG: 807 | Disposition: A | Discharge status: Home / Self Care | Payer: 59 | Attending: Obstetrics and Gynecology | Admitting: Obstetrics and Gynecology

## 2021-06-23 ENCOUNTER — Other Ambulatory Visit: Payer: Self-pay

## 2021-06-23 ENCOUNTER — Encounter (HOSPITAL_COMMUNITY): Payer: Self-pay | Admitting: Obstetrics and Gynecology

## 2021-06-23 DIAGNOSIS — O139 Gestational [pregnancy-induced] hypertension without significant proteinuria, unspecified trimester: Secondary | ICD-10-CM

## 2021-06-23 DIAGNOSIS — O133 Gestational [pregnancy-induced] hypertension without significant proteinuria, third trimester: Secondary | ICD-10-CM | POA: Diagnosis not present

## 2021-06-23 DIAGNOSIS — Z3A39 39 weeks gestation of pregnancy: Secondary | ICD-10-CM | POA: Diagnosis not present

## 2021-06-23 DIAGNOSIS — O479 False labor, unspecified: Secondary | ICD-10-CM | POA: Diagnosis not present

## 2021-06-23 DIAGNOSIS — R030 Elevated blood-pressure reading, without diagnosis of hypertension: Secondary | ICD-10-CM | POA: Diagnosis present

## 2021-06-23 DIAGNOSIS — Z20822 Contact with and (suspected) exposure to covid-19: Secondary | ICD-10-CM | POA: Diagnosis present

## 2021-06-23 DIAGNOSIS — O134 Gestational [pregnancy-induced] hypertension without significant proteinuria, complicating childbirth: Principal | ICD-10-CM | POA: Diagnosis present

## 2021-06-23 DIAGNOSIS — O99824 Streptococcus B carrier state complicating childbirth: Secondary | ICD-10-CM | POA: Diagnosis present

## 2021-06-23 HISTORY — DX: Gestational (pregnancy-induced) hypertension without significant proteinuria, unspecified trimester: O13.9

## 2021-06-23 HISTORY — DX: Other specified health status: Z78.9

## 2021-06-23 LAB — PROTEIN / CREATININE RATIO, URINE
Creatinine, Urine: 43.81 mg/dL
Total Protein, Urine: <6 mg/dL

## 2021-06-23 LAB — COMPREHENSIVE METABOLIC PANEL
ALT: 17 U/L (ref 0–44)
ALT: 17 U/L (ref 0–44)
AST: 20 U/L (ref 15–41)
AST: 21 U/L (ref 15–41)
Albumin: 3 g/dL — ABNORMAL LOW (ref 3.5–5.0)
Albumin: 3.2 g/dL — ABNORMAL LOW (ref 3.5–5.0)
Alkaline Phosphatase: 145 U/L — ABNORMAL HIGH (ref 38–126)
Alkaline Phosphatase: 146 U/L — ABNORMAL HIGH (ref 38–126)
Anion gap: 12 (ref 5–15)
Anion gap: 10 (ref 5–15)
BUN: 10 mg/dL (ref 6–20)
BUN: 5 mg/dL — ABNORMAL LOW (ref 6–20)
CO2: 19 mmol/L — ABNORMAL LOW (ref 22–32)
CO2: 21 mmol/L — ABNORMAL LOW (ref 22–32)
Calcium: 8.9 mg/dL (ref 8.9–10.3)
Calcium: 9.1 mg/dL (ref 8.9–10.3)
Chloride: 104 mmol/L (ref 98–111)
Chloride: 103 mmol/L (ref 98–111)
Creatinine, Ser: 0.65 mg/dL (ref 0.44–1.00)
Creatinine, Ser: 0.69 mg/dL (ref 0.44–1.00)
GFR, Estimated: >60 mL/min (ref >60)
GFR, Estimated: >60 mL/min (ref >60)
Glucose, Bld: 88 mg/dL (ref 70–99)
Glucose, Bld: 85 mg/dL (ref 70–99)
Potassium: 3.9 mmol/L (ref 3.5–5.1)
Potassium: 4.1 mmol/L (ref 3.5–5.1)
Sodium: 135 mmol/L (ref 135–145)
Sodium: 134 mmol/L — ABNORMAL LOW (ref 135–145)
Total Bilirubin: 0.1 mg/dL — ABNORMAL LOW (ref 0.3–1.2)
Total Bilirubin: 0.3 mg/dL (ref 0.3–1.2)
Total Protein: 6.4 g/dL — ABNORMAL LOW (ref 6.5–8.1)
Total Protein: 6.9 g/dL (ref 6.5–8.1)

## 2021-06-23 LAB — CBC
HCT: 39.4 % (ref 36.0–46.0)
HCT: 38.9 % (ref 36.0–46.0)
Hemoglobin: 12.9 g/dL (ref 12.0–15.0)
Hemoglobin: 13.1 g/dL (ref 12.0–15.0)
MCH: 27.8 pg (ref 26.0–34.0)
MCH: 28.6 pg (ref 26.0–34.0)
MCHC: 32.7 g/dL (ref 30.0–36.0)
MCHC: 33.7 g/dL (ref 30.0–36.0)
MCV: 84.9 fL (ref 80.0–100.0)
MCV: 84.9 fL (ref 80.0–100.0)
Platelets: 272 10*3/uL (ref 150–400)
Platelets: 277 10*3/uL (ref 150–400)
RBC: 4.64 MIL/uL (ref 3.87–5.11)
RBC: 4.58 MIL/uL (ref 3.87–5.11)
RDW: 13.4 % (ref 11.5–15.5)
RDW: 13.4 % (ref 11.5–15.5)
WBC: 14.1 10*3/uL — ABNORMAL HIGH (ref 4.0–10.5)
WBC: 13.7 10*3/uL — ABNORMAL HIGH (ref 4.0–10.5)
nRBC: 0 % (ref 0.0–0.2)
nRBC: 0 % (ref 0.0–0.2)

## 2021-06-23 LAB — RESP PANEL BY RT-PCR (FLU A&B, COVID) ARPGX2
Influenza A by PCR: NEGATIVE
Influenza B by PCR: NEGATIVE
SARS Coronavirus 2 by RT PCR: NEGATIVE

## 2021-06-23 LAB — TYPE AND SCREEN
ABO/RH(D): A POS
Antibody Screen: NEGATIVE

## 2021-06-23 MED ORDER — OXYTOCIN-SODIUM CHLORIDE 30-0.9 UT/500ML-% IV SOLN
2.5000 [IU]/h | INTRAVENOUS | Status: DC
  Filled 2021-06-23: qty 500

## 2021-06-23 MED ORDER — OXYTOCIN-SODIUM CHLORIDE 30-0.9 UT/500ML-% IV SOLN: 1.0000 m[IU]/min | INTRAVENOUS | Status: DC

## 2021-06-23 MED ORDER — LACTATED RINGERS IV SOLN: 500.0000 mL | INTRAVENOUS | Status: DC | PRN

## 2021-06-23 MED ORDER — EPHEDRINE 5 MG/ML INJ: 10.0000 mg | INTRAVENOUS | Status: DC | PRN

## 2021-06-23 MED ORDER — PHENYLEPHRINE 40 MCG/ML (10ML) SYRINGE FOR IV PUSH (FOR BLOOD PRESSURE SUPPORT): 80.0000 ug | PREFILLED_SYRINGE | INTRAVENOUS | Status: DC | PRN

## 2021-06-23 MED ORDER — ONDANSETRON HCL 4 MG/2ML IJ SOLN: 4.0000 mg | Freq: Four times a day (QID) | INTRAMUSCULAR | Status: DC | PRN

## 2021-06-23 MED ORDER — TERBUTALINE SULFATE 1 MG/ML IJ SOLN: 0.2500 mg | Freq: Once | INTRAMUSCULAR | Status: DC | PRN

## 2021-06-23 MED ORDER — PENICILLIN G POT IN DEXTROSE 60000 UNIT/ML IV SOLN
3.0000 10*6.[IU] | INTRAVENOUS | Status: DC
  Administered 2021-06-23 – 2021-06-24 (×4): 3 10*6.[IU] via INTRAVENOUS
  Filled 2021-06-23 (×4): qty 50

## 2021-06-23 MED ORDER — HYDRALAZINE HCL 20 MG/ML IJ SOLN: 10.0000 mg | INTRAMUSCULAR | Status: DC | PRN

## 2021-06-23 MED ORDER — LACTATED RINGERS IV SOLN: INTRAVENOUS | Status: DC

## 2021-06-23 MED ORDER — FLEET ENEMA 7-19 GM/118ML RE ENEM: 1.0000 | ENEMA | RECTAL | Status: DC | PRN

## 2021-06-23 MED ORDER — OXYTOCIN BOLUS FROM INFUSION
333.0000 mL | Freq: Once | INTRAVENOUS | Status: AC
  Administered 2021-06-24: 333 mL via INTRAVENOUS

## 2021-06-23 MED ORDER — SOD CITRATE-CITRIC ACID 500-334 MG/5ML PO SOLN: 30.0000 mL | ORAL | Status: DC | PRN

## 2021-06-23 MED ORDER — MAGNESIUM SULFATE BOLUS VIA INFUSION
4.0000 g | Freq: Once | INTRAVENOUS | Status: AC
  Administered 2021-06-23: 4 g via INTRAVENOUS
  Filled 2021-06-23: qty 1000

## 2021-06-23 MED ORDER — PHENYLEPHRINE 40 MCG/ML (10ML) SYRINGE FOR IV PUSH (FOR BLOOD PRESSURE SUPPORT)
80.0000 ug | PREFILLED_SYRINGE | INTRAVENOUS | Status: DC | PRN
  Filled 2021-06-23: qty 10

## 2021-06-23 MED ORDER — LABETALOL HCL 5 MG/ML IV SOLN: 80.0000 mg | INTRAVENOUS | Status: DC | PRN

## 2021-06-23 MED ORDER — FENTANYL CITRATE (PF) 100 MCG/2ML IJ SOLN
50.0000 ug | INTRAMUSCULAR | Status: DC | PRN
  Administered 2021-06-23: 100 ug via INTRAVENOUS
  Filled 2021-06-23: qty 2

## 2021-06-23 MED ORDER — MAGNESIUM SULFATE 40 GM/1000ML IV SOLN
2.0000 g/h | INTRAVENOUS | Status: DC
  Filled 2021-06-23: qty 1000

## 2021-06-23 MED ORDER — ACETAMINOPHEN 325 MG PO TABS
650.0000 mg | ORAL_TABLET | ORAL | Status: DC | PRN
  Filled 2021-06-23: qty 2

## 2021-06-23 MED ORDER — LIDOCAINE HCL (PF) 1 % IJ SOLN
INTRAMUSCULAR | Status: DC | PRN
  Administered 2021-06-23: 5 mL via EPIDURAL
  Administered 2021-06-23: 6 mL via EPIDURAL

## 2021-06-23 MED ORDER — OXYTOCIN-SODIUM CHLORIDE 30-0.9 UT/500ML-% IV SOLN
1.0000 m[IU]/min | INTRAVENOUS | Status: DC
  Administered 2021-06-23: 2 m[IU]/min via INTRAVENOUS
  Filled 2021-06-23: qty 500

## 2021-06-23 MED ORDER — LABETALOL HCL 5 MG/ML IV SOLN
20.0000 mg | INTRAVENOUS | Status: DC | PRN
  Administered 2021-06-23 – 2021-06-24 (×3): 20 mg via INTRAVENOUS
  Filled 2021-06-23 (×4): qty 4

## 2021-06-23 MED ORDER — LACTATED RINGERS IV SOLN: 500.0000 mL | Freq: Once | INTRAVENOUS | Status: DC

## 2021-06-23 MED ORDER — LIDOCAINE HCL (PF) 1 % IJ SOLN: 30.0000 mL | INTRAMUSCULAR | Status: DC | PRN

## 2021-06-23 MED ORDER — SODIUM CHLORIDE 0.9 % IV SOLN
5.0000 10*6.[IU] | Freq: Once | INTRAVENOUS | Status: AC
  Administered 2021-06-23: 5 10*6.[IU] via INTRAVENOUS
  Filled 2021-06-23: qty 5

## 2021-06-23 MED ORDER — DIPHENHYDRAMINE HCL 50 MG/ML IJ SOLN
12.5000 mg | INTRAMUSCULAR | Status: DC | PRN
  Administered 2021-06-24: 12.5 mg via INTRAVENOUS
  Filled 2021-06-23: qty 1

## 2021-06-23 MED ORDER — FENTANYL-BUPIVACAINE-NACL 0.5-0.125-0.9 MG/250ML-% EP SOLN
12.0000 mL/h | EPIDURAL | Status: DC | PRN
  Administered 2021-06-23: 12 mL/h via EPIDURAL
  Filled 2021-06-23: qty 250

## 2021-06-23 MED ORDER — LABETALOL HCL 5 MG/ML IV SOLN: 40.0000 mg | INTRAVENOUS | Status: DC | PRN

## 2021-06-23 NOTE — H&P (Signed)
Meghan Mosley is a 35 y.o. female presenting for UCs. Evaluation in MAU noted irregular UCs and elevated BPs-one in severe range>resolved without treatment. She denies HA, vision change and epigastric pain. Pregnancy complicated by anxiety. OB History     Gravida  1   Para  0   Term      Preterm      AB      Living         SAB      IAB      Ectopic      Multiple      Live Births             Past Medical History:  Diagnosis Date   Medical history non-contributory    Past Surgical History:  Procedure Laterality Date   MOUTH SURGERY  2008   wisdom teeth, have re grown   Family History: family history includes Breast cancer in her paternal grandmother; Diabetes in an other family member; Fibroids in an other family member; Heart attack in her maternal grandfather; Heart disease in her mother; Hypertension in her maternal grandfather, mother, and paternal grandfather. Social History:  reports that she has never smoked. She has never used smokeless tobacco. She reports that she does not currently use alcohol. She reports that she does not use drugs.     Maternal Diabetes: No Genetic Screening: Normal Maternal Ultrasounds/Referrals: Normal Fetal Ultrasounds or other Referrals:  None Maternal Substance Abuse:  No Significant Maternal Medications:  None Significant Maternal Lab Results:  Group B Strep positive Other Comments:  None  Review of Systems  Constitutional:  Negative for fever.  Eyes:  Negative for visual disturbance.  Gastrointestinal:  Negative for abdominal pain.  Neurological:  Negative for headaches.  Maternal Medical History:  Reason for admission: Contractions.   Contractions: Onset was 13-24 hours ago.   Fetal activity: Perceived fetal activity is normal.    Dilation: 1 Effacement (%): 70 Station: -3 Exam by:: Quintella Baton, RN Blood pressure (!) 144/81, pulse 98, resp. rate 17, height 5\' 3"  (1.6 m), weight 98.9 kg, SpO2 96 %. Maternal Exam:   Abdomen: Fetal presentation: vertex  Physical Exam Cardiovascular:     Rate and Rhythm: Normal rate.  Pulmonary:     Effort: Pulmonary effort is normal.    Prenatal labs: ABO, Rh: --/--/PENDING (12/09 10-20-1976) Antibody: PENDING (12/09 0708) Rubella: Immune (05/03 0000) RPR: Nonreactive (05/03 0000)  HBsAg: Negative (05/03 0000)  HIV: Non-reactive (05/03 0000)  GBS: Positive/-- (11/23 0000)   Assessment/Plan: 35 yo G1P0 @ 39 6/7 wks PIH Will watch BP closely. If elevate or sxs develop will start magnesium sulfate.   8/7 II 06/23/2021, 10:06 AM

## 2021-06-23 NOTE — MAU Note (Signed)
Ctxs since 0500 yesterday which have become closer and stronger. Denies LOF or VB. 1cm last sve. Good FM

## 2021-06-23 NOTE — Progress Notes (Signed)
Epidural just placed FHT cat one UCs q2-3 min BP persistent severe range > labetalol D/W patient preeclampsia>begin magnesium sulfate

## 2021-06-23 NOTE — Anesthesia Procedure Notes (Signed)
Epidural Patient location during procedure: OB Start time: 06/23/2021 7:20 PM End time: 06/23/2021 7:29 PM  Staffing Anesthesiologist: Mal Amabile, MD Performed: anesthesiologist   Preanesthetic Checklist Completed: patient identified, IV checked, site marked, risks and benefits discussed, surgical consent, monitors and equipment checked, pre-op evaluation and timeout performed  Epidural Patient position: sitting Prep: DuraPrep and site prepped and draped Patient monitoring: continuous pulse ox and blood pressure Approach: midline Location: L3-L4 Injection technique: LOR air  Needle:  Needle type: Tuohy  Needle gauge: 17 G Needle length: 9 cm and 9 Needle insertion depth: 6 cm Catheter type: closed end flexible Catheter size: 19 Gauge Catheter at skin depth: 11 cm Test dose: negative and Other  Assessment Events: blood not aspirated, injection not painful, no injection resistance, paresthesia and negative IV test  Additional Notes Patient identified. Risks and benefits discussed including failed block, incomplete  Pain control, post dural puncture headache, nerve damage, paralysis, blood pressure Changes, nausea, vomiting, reactions to medications-both toxic and allergic and post Partum back pain. All questions were answered. Patient expressed understanding and wished to proceed. Sterile technique was used throughout procedure. Epidural site was Dressed with sterile barrier dressing. No paresthesias, signs of intravascular injection Or signs of intrathecal spread were encountered. Paresthesia left leg x 1 on threading catheter, needle redirected, no paresthesia on threading catheter 2nd time. Patient was more comfortable after the epidural was dosed. Please see RN's note for documentation of vital signs and FHR which are stable. Reason for block:procedure for pain

## 2021-06-23 NOTE — Progress Notes (Signed)
FHT cat one Cx 4/puffy/'2 IUPC placed

## 2021-06-23 NOTE — Anesthesia Preprocedure Evaluation (Signed)
Anesthesia Evaluation  Patient identified by MRN, date of birth, ID band Patient awake    Reviewed: Allergy & Precautions, Patient's Chart, lab work & pertinent test results  Airway Mallampati: II  TM Distance: >3 FB Neck ROM: Full    Dental no notable dental hx. (+) Teeth Intact   Pulmonary neg pulmonary ROS,    Pulmonary exam normal breath sounds clear to auscultation       Cardiovascular hypertension, Normal cardiovascular exam Rhythm:Regular Rate:Normal     Neuro/Psych negative neurological ROS  negative psych ROS   GI/Hepatic Neg liver ROS, GERD  ,  Endo/Other  Obesity  Renal/GU negative Renal ROS  negative genitourinary   Musculoskeletal negative musculoskeletal ROS (+)   Abdominal (+) + obese,   Peds  Hematology negative hematology ROS (+)   Anesthesia Other Findings   Reproductive/Obstetrics (+) Pregnancy                             Anesthesia Physical Anesthesia Plan  ASA: 3  Anesthesia Plan: Epidural   Post-op Pain Management:    Induction:   PONV Risk Score and Plan:   Airway Management Planned: Natural Airway  Additional Equipment:   Intra-op Plan:   Post-operative Plan:   Informed Consent: I have reviewed the patients History and Physical, chart, labs and discussed the procedure including the risks, benefits and alternatives for the proposed anesthesia with the patient or authorized representative who has indicated his/her understanding and acceptance.       Plan Discussed with: Anesthesiologist  Anesthesia Plan Comments:         Anesthesia Quick Evaluation

## 2021-06-23 NOTE — MAU Provider Note (Signed)
Chief Complaint:  Contractions   Event Date/Time   First Provider Initiated Contact with Patient 06/23/21 0532     HPI: Meghan Mosley is a 35 y.o. G1P0 at 71w6dwho presents to maternity admissions reporting contractions.  Also has elevated BPs today.  Had one elevated BP a week ago, but they attributed it to stress.  . She reports good fetal movement, denies LOF, vaginal bleeding, vaginal itching/burning, urinary symptoms, h/a, dizziness, n/v, diarrhea, constipation or fever/chills.  She denies headache, visual changes or RUQ abdominal pain.  Hypertension This is a new problem. The current episode started in the past 7 days. The problem has been gradually worsening since onset. Pertinent negatives include no anxiety, blurred vision, chest pain or headaches. Past treatments include nothing. There are no compliance problems.   Abdominal Pain This is a new problem. The current episode started today. The onset quality is gradual. The problem occurs intermittently. The quality of the pain is cramping. Pertinent negatives include no fever, headaches or nausea. Nothing aggravates the pain. The pain is relieved by Nothing.   Past Medical History: Past Medical History:  Diagnosis Date   Medical history non-contributory     Past obstetric history: OB History  Gravida Para Term Preterm AB Living  1 0          SAB IAB Ectopic Multiple Live Births               # Outcome Date GA Lbr Len/2nd Weight Sex Delivery Anes PTL Lv  1 Current             Past Surgical History: Past Surgical History:  Procedure Laterality Date   MOUTH SURGERY  2008   wisdom teeth, have re grown    Family History: Family History  Problem Relation Age of Onset   Hypertension Mother    Heart disease Mother    Hypertension Maternal Grandfather    Heart attack Maternal Grandfather    Breast cancer Paternal Grandmother    Hypertension Paternal Grandfather    Diabetes Other    Fibroids Other     Social  History: Social History   Tobacco Use   Smoking status: Never   Smokeless tobacco: Never  Vaping Use   Vaping Use: Never used  Substance Use Topics   Alcohol use: Not Currently    Comment: social   Drug use: No    Allergies:  Allergies  Allergen Reactions   Gluten Meal Diarrhea and Nausea And Vomiting   Pineapple    Tramadol Diarrhea and Nausea And Vomiting    Meds:  Medications Prior to Admission  Medication Sig Dispense Refill Last Dose   azelastine (ASTELIN) 0.1 % nasal spray Place 2 sprays into both nostrils 2 (two) times daily. Use in each nostril as directed 30 mL 12    benzonatate (TESSALON) 200 MG capsule Take 1 capsule (200 mg total) by mouth 2 (two) times daily as needed for cough. 20 capsule 0    FLUoxetine (PROZAC) 20 MG tablet TAKE 1 TABLET (20 MG TOTAL) BY MOUTH DAILY BEFORE BREAKFAST. 90 tablet 0    predniSONE (DELTASONE) 20 MG tablet Take 2 tablets (40 mg total) by mouth daily with breakfast. 8 tablet 0     I have reviewed patient's Past Medical Hx, Surgical Hx, Family Hx, Social Hx, medications and allergies.   ROS:  Review of Systems  Constitutional:  Negative for fever.  Eyes:  Negative for blurred vision.  Cardiovascular:  Negative for chest pain.  Gastrointestinal:  Positive for abdominal pain. Negative for nausea.  Neurological:  Negative for headaches.  Other systems negative  Physical Exam  Patient Vitals for the past 24 hrs:  BP Pulse Resp SpO2 Height Weight  06/23/21 0530 (!) 157/91 (!) 112 -- 98 % -- --  06/23/21 0522 (!) 147/98 (!) 118 -- -- -- --  06/23/21 0505 (!) 148/86 (!) 115 -- 100 % -- --  06/23/21 0502 -- -- 17 -- 5\' 3"  (1.6 m) 98.9 kg   Vitals:   06/23/21 0615 06/23/21 0630 06/23/21 0645 06/23/21 0700  BP: (!) 147/93 (!) 141/84 (!) 162/94 (!) 152/100  Pulse: 98 97 99 100  Resp:      SpO2: 97% 95% 96% 100%  Weight:      Height:        Constitutional: Well-developed, well-nourished female in no acute distress.   Cardiovascular: normal rate and rhythm Respiratory: normal effort, clear to auscultation bilaterally GI: Abd soft, non-tender, gravid appropriate for gestational age.   No rebound or guarding. MS: Extremities nontender, Trace edema, normal ROM Neurologic: Alert and oriented x 4.  GU: Neg CVAT.  PELVIC EXAM:  Dilation: 1 Effacement (%): 70 Station: -3 Presentation: Vertex Exam by:: [email or web address], RN  FHT:  Baseline 125 , moderate variability, accelerations present, no decelerations Contractions: Irregular     Labs: Results for orders placed or performed during the hospital encounter of 06/23/21 (from the past 24 hour(s))  Protein / creatinine ratio, urine     Status: None   Collection Time: 06/23/21  5:33 AM  Result Value Ref Range   Creatinine, Urine 43.81 mg/dL   Total Protein, Urine <6 mg/dL   Protein Creatinine Ratio        0.00 - 0.15 mg/mg[Cre]  CBC     Status: Abnormal   Collection Time: 06/23/21  6:01 AM  Result Value Ref Range   WBC 14.1 (H) 4.0 - 10.5 K/uL   RBC 4.64 3.87 - 5.11 MIL/uL   Hemoglobin 12.9 12.0 - 15.0 g/dL   HCT 14/09/22 48.5 - 46.2 %   MCV 84.9 80.0 - 100.0 fL   MCH 27.8 26.0 - 34.0 pg   MCHC 32.7 30.0 - 36.0 g/dL   RDW 70.3 50.0 - 93.8 %   Platelets 272 150 - 400 K/uL   nRBC 0.0 0.0 - 0.2 %  Comprehensive metabolic panel     Status: Abnormal   Collection Time: 06/23/21  6:01 AM  Result Value Ref Range   Sodium 135 135 - 145 mmol/L   Potassium 3.9 3.5 - 5.1 mmol/L   Chloride 104 98 - 111 mmol/L   CO2 19 (L) 22 - 32 mmol/L   Glucose, Bld 88 70 - 99 mg/dL   BUN 10 6 - 20 mg/dL   Creatinine, Ser 14/09/22 0.44 - 1.00 mg/dL   Calcium 8.9 8.9 - 9.93 mg/dL   Total Protein 6.4 (L) 6.5 - 8.1 g/dL   Albumin 3.0 (L) 3.5 - 5.0 g/dL   AST 20 15 - 41 U/L   ALT 17 0 - 44 U/L   Alkaline Phosphatase 145 (H) 38 - 126 U/L   Total Bilirubin 0.1 (L) 0.3 - 1.2 mg/dL   GFR, Estimated 71.6 >96 mL/min   Anion gap 12 5 - 15       Imaging:  No results found.  MAU  Course/MDM: I have ordered labs and reviewed results. These are normal  BPs reviewed, one was severe range. NST reviewed, reassuring Consult Dr  Tomblin with presentation, exam findings and test results.  Treatments in MAU included EFM.    Assessment: Single IUP at [redacted]w[redacted]d Gestational Hypertension Latent phase labor  Plan: Admit to Labor and Delivery Dr Henderson Cloud to follow  Wynelle Bourgeois CNM, MSN Certified Nurse-Midwife 06/23/2021 5:32 AM

## 2021-06-24 LAB — CBC WITH DIFFERENTIAL/PLATELET
Abs Immature Granulocytes: 0.13 10*3/uL — ABNORMAL HIGH (ref 0.00–0.07)
Basophils Absolute: 0 10*3/uL (ref 0.0–0.1)
Basophils Relative: 0 %
Eosinophils Absolute: 0 10*3/uL (ref 0.0–0.5)
Eosinophils Relative: 0 %
HCT: 36.3 % (ref 36.0–46.0)
Hemoglobin: 12.2 g/dL (ref 12.0–15.0)
Immature Granulocytes: 1 %
Lymphocytes Relative: 4 %
Lymphs Abs: 0.9 10*3/uL (ref 0.7–4.0)
MCH: 28.9 pg (ref 26.0–34.0)
MCHC: 33.6 g/dL (ref 30.0–36.0)
MCV: 86 fL (ref 80.0–100.0)
Monocytes Absolute: 1.4 10*3/uL — ABNORMAL HIGH (ref 0.1–1.0)
Monocytes Relative: 6 %
Neutro Abs: 21.7 10*3/uL — ABNORMAL HIGH (ref 1.7–7.7)
Neutrophils Relative %: 89 %
Platelets: 267 10*3/uL (ref 150–400)
RBC: 4.22 MIL/uL (ref 3.87–5.11)
RDW: 13.6 % (ref 11.5–15.5)
WBC: 24.2 10*3/uL — ABNORMAL HIGH (ref 4.0–10.5)
nRBC: 0 % (ref 0.0–0.2)

## 2021-06-24 LAB — CBC
HCT: 33.3 % — ABNORMAL LOW (ref 36.0–46.0)
Hemoglobin: 11.1 g/dL — ABNORMAL LOW (ref 12.0–15.0)
MCH: 28.6 pg (ref 26.0–34.0)
MCHC: 33.3 g/dL (ref 30.0–36.0)
MCV: 85.8 fL (ref 80.0–100.0)
Platelets: 236 10*3/uL (ref 150–400)
RBC: 3.88 MIL/uL (ref 3.87–5.11)
RDW: 13.7 % (ref 11.5–15.5)
WBC: 19.7 10*3/uL — ABNORMAL HIGH (ref 4.0–10.5)
nRBC: 0 % (ref 0.0–0.2)

## 2021-06-24 LAB — COMPREHENSIVE METABOLIC PANEL
ALT: 14 U/L (ref 0–44)
AST: 22 U/L (ref 15–41)
Albumin: 2.5 g/dL — ABNORMAL LOW (ref 3.5–5.0)
Alkaline Phosphatase: 111 U/L (ref 38–126)
Anion gap: 8 (ref 5–15)
BUN: 11 mg/dL (ref 6–20)
CO2: 22 mmol/L (ref 22–32)
Calcium: 7.3 mg/dL — ABNORMAL LOW (ref 8.9–10.3)
Chloride: 106 mmol/L (ref 98–111)
Creatinine, Ser: 0.84 mg/dL (ref 0.44–1.00)
GFR, Estimated: >60 mL/min (ref >60)
Glucose, Bld: 157 mg/dL — ABNORMAL HIGH (ref 70–99)
Potassium: 3.7 mmol/L (ref 3.5–5.1)
Sodium: 136 mmol/L (ref 135–145)
Total Bilirubin: 0.4 mg/dL (ref 0.3–1.2)
Total Protein: 5.4 g/dL — ABNORMAL LOW (ref 6.5–8.1)

## 2021-06-24 LAB — MAGNESIUM: Magnesium: 4.6 mg/dL — ABNORMAL HIGH (ref 1.7–2.4)

## 2021-06-24 LAB — RPR: RPR Ser Ql: NONREACTIVE

## 2021-06-24 MED ORDER — BENZOCAINE-MENTHOL 20-0.5 % EX AERO
1.0000 "application " | INHALATION_SPRAY | CUTANEOUS | Status: DC | PRN
  Filled 2021-06-24: qty 56

## 2021-06-24 MED ORDER — LACTATED RINGERS IV SOLN: INTRAVENOUS | Status: DC

## 2021-06-24 MED ORDER — MAGNESIUM SULFATE 40 GM/1000ML IV SOLN
2.0000 g/h | INTRAVENOUS | Status: AC
  Administered 2021-06-24: 2 g/h via INTRAVENOUS
  Filled 2021-06-24: qty 1000

## 2021-06-24 MED ORDER — IBUPROFEN 600 MG PO TABS
600.0000 mg | ORAL_TABLET | Freq: Four times a day (QID) | ORAL | Status: DC
  Administered 2021-06-24 – 2021-06-25 (×4): 600 mg via ORAL
  Filled 2021-06-24 (×4): qty 1

## 2021-06-24 MED ORDER — WITCH HAZEL-GLYCERIN EX PADS: 1.0000 "application " | MEDICATED_PAD | CUTANEOUS | Status: DC | PRN

## 2021-06-24 MED ORDER — SIMETHICONE 80 MG PO CHEW: 80.0000 mg | CHEWABLE_TABLET | ORAL | Status: DC | PRN

## 2021-06-24 MED ORDER — FLUOXETINE HCL 20 MG PO TABS: 20.0000 mg | ORAL_TABLET | Freq: Every day | ORAL | Status: DC

## 2021-06-24 MED ORDER — DIBUCAINE (PERIANAL) 1 % EX OINT: 1.0000 "application " | TOPICAL_OINTMENT | CUTANEOUS | Status: DC | PRN

## 2021-06-24 MED ORDER — SODIUM CHLORIDE 0.9 % IV SOLN: 5.0000 10*6.[IU] | Freq: Once | INTRAVENOUS | Status: DC

## 2021-06-24 MED ORDER — LABETALOL HCL 200 MG PO TABS: 200.0000 mg | ORAL_TABLET | Freq: Three times a day (TID) | ORAL | Status: DC

## 2021-06-24 MED ORDER — ONDANSETRON HCL 4 MG/2ML IJ SOLN: 4.0000 mg | INTRAMUSCULAR | Status: DC | PRN

## 2021-06-24 MED ORDER — SENNOSIDES-DOCUSATE SODIUM 8.6-50 MG PO TABS
2.0000 | ORAL_TABLET | ORAL | Status: DC
  Administered 2021-06-24 – 2021-06-25 (×2): 2 via ORAL
  Filled 2021-06-24 (×2): qty 2

## 2021-06-24 MED ORDER — TETANUS-DIPHTH-ACELL PERTUSSIS 5-2.5-18.5 LF-MCG/0.5 IM SUSY: 0.5000 mL | PREFILLED_SYRINGE | Freq: Once | INTRAMUSCULAR | Status: DC

## 2021-06-24 MED ORDER — PENICILLIN G POT IN DEXTROSE 60000 UNIT/ML IV SOLN: 3.0000 10*6.[IU] | INTRAVENOUS | Status: DC

## 2021-06-24 MED ORDER — ZOLPIDEM TARTRATE 5 MG PO TABS: 5.0000 mg | ORAL_TABLET | Freq: Every evening | ORAL | Status: DC | PRN

## 2021-06-24 MED ORDER — PRENATAL MULTIVITAMIN CH
1.0000 | ORAL_TABLET | Freq: Every day | ORAL | Status: DC
  Administered 2021-06-25: 1 via ORAL
  Filled 2021-06-24: qty 1

## 2021-06-24 MED ORDER — ACETAMINOPHEN 325 MG PO TABS
650.0000 mg | ORAL_TABLET | ORAL | Status: DC | PRN
  Administered 2021-06-24: 650 mg via ORAL

## 2021-06-24 MED ORDER — OXYCODONE HCL 5 MG PO TABS: 5.0000 mg | ORAL_TABLET | ORAL | Status: DC | PRN

## 2021-06-24 MED ORDER — COCONUT OIL OIL: 1.0000 "application " | TOPICAL_OIL | Status: DC | PRN

## 2021-06-24 MED ORDER — AZELASTINE HCL 0.1 % NA SOLN: 2.0000 | Freq: Two times a day (BID) | NASAL | Status: DC

## 2021-06-24 MED ORDER — DIPHENHYDRAMINE HCL 25 MG PO CAPS: 25.0000 mg | ORAL_CAPSULE | Freq: Four times a day (QID) | ORAL | Status: DC | PRN

## 2021-06-24 MED ORDER — ONDANSETRON HCL 4 MG PO TABS: 4.0000 mg | ORAL_TABLET | ORAL | Status: DC | PRN

## 2021-06-24 NOTE — Progress Notes (Signed)
RN to patient room approximately 1200. PT SO at bedside and pt not in the bed. RN heard sound in the bathroom and upon entrance into the bathroom, RN found pt sitting on the floor. Pt states tried to take a step and fell. Patient stated no pain. RN found no ecchymosis, break or tear, or redness on skin upon assessment after fall. VS WNL, patient alert and oriented x4, and ambulatory. Provider notified at 1213. No new orders received. SO of patient aware.       06/24/21 1206  Vital Signs  BP 129/68  Pulse Rate 87  Resp 19  Temp 97.7 F (36.5 C)  Oxygen Therapy  SpO2 99 %

## 2021-06-24 NOTE — Progress Notes (Signed)
Delivery Note At 5:40 AM a viable female was delivered via Vaginal, Spontaneous (Presentation: Left Occiput Anterior).  APGAR: 9, 9; weight  .   Placenta status: Spontaneous, Intact.  Cord: 3 vessels with the following complications: None.  Cord pH: pending  Anesthesia: Epidural Episiotomy: None Lacerations:  bilateral superficial periurethral lacerations not bleeding not repaired Suture Repair:  Est. Blood Loss (mL):  350  Mom to postpartum.  Baby to Couplet care / Skin to Skin. PP magnesium sulfate Roselle Locus II 06/24/2021, 5:57 AM

## 2021-06-24 NOTE — Plan of Care (Signed)
  Problem: Education: Goal: Knowledge of General Education information will improve Description: Including pain rating scale, medication(s)/side effects and non-pharmacologic comfort measures Outcome: Progressing   Problem: Health Behavior/Discharge Planning: Goal: Ability to manage health-related needs will improve Outcome: Progressing   Problem: Clinical Measurements: Goal: Ability to maintain clinical measurements within normal limits will improve Outcome: Progressing Goal: Will remain free from infection Outcome: Progressing Goal: Diagnostic test results will improve Outcome: Progressing Goal: Respiratory complications will improve Outcome: Progressing Goal: Cardiovascular complication will be avoided Outcome: Progressing   Problem: Activity: Goal: Risk for activity intolerance will decrease Outcome: Progressing   Problem: Nutrition: Goal: Adequate nutrition will be maintained Outcome: Progressing   Problem: Coping: Goal: Level of anxiety will decrease Outcome: Progressing   Problem: Elimination: Goal: Will not experience complications related to bowel motility Outcome: Progressing Goal: Will not experience complications related to urinary retention Outcome: Progressing   Problem: Pain Managment: Goal: General experience of comfort will improve Outcome: Progressing   Problem: Safety: Goal: Ability to remain free from injury will improve Outcome: Progressing   Problem: Skin Integrity: Goal: Risk for impaired skin integrity will decrease Outcome: Progressing   Problem: Education: Goal: Knowledge of condition will improve Outcome: Progressing Goal: Individualized Educational Video(s) Outcome: Progressing Goal: Individualized Newborn Educational Video(s) Outcome: Progressing   Problem: Activity: Goal: Will verbalize the importance of balancing activity with adequate rest periods Outcome: Progressing Goal: Ability to tolerate increased activity will  improve Outcome: Progressing   Problem: Coping: Goal: Ability to identify and utilize available resources and services will improve Outcome: Progressing   Problem: Life Cycle: Goal: Chance of risk for complications during the postpartum period will decrease Outcome: Progressing   Problem: Role Relationship: Goal: Ability to demonstrate positive interaction with newborn will improve Outcome: Progressing   Problem: Skin Integrity: Goal: Demonstration of wound healing without infection will improve Outcome: Progressing   Problem: Education: Goal: Knowledge of General Education information will improve Description: Including pain rating scale, medication(s)/side effects and non-pharmacologic comfort measures Outcome: Progressing   Problem: Health Behavior/Discharge Planning: Goal: Ability to manage health-related needs will improve Outcome: Progressing   Problem: Clinical Measurements: Goal: Ability to maintain clinical measurements within normal limits will improve Outcome: Progressing Goal: Will remain free from infection Outcome: Progressing Goal: Diagnostic test results will improve Outcome: Progressing Goal: Respiratory complications will improve Outcome: Progressing Goal: Cardiovascular complication will be avoided Outcome: Progressing   Problem: Activity: Goal: Risk for activity intolerance will decrease Outcome: Progressing   Problem: Nutrition: Goal: Adequate nutrition will be maintained Outcome: Progressing   Problem: Coping: Goal: Level of anxiety will decrease Outcome: Progressing   Problem: Elimination: Goal: Will not experience complications related to bowel motility Outcome: Progressing Goal: Will not experience complications related to urinary retention Outcome: Progressing   Problem: Pain Managment: Goal: General experience of comfort will improve Outcome: Progressing   Problem: Safety: Goal: Ability to remain free from injury will  improve Outcome: Progressing   Problem: Skin Integrity: Goal: Risk for impaired skin integrity will decrease Outcome: Progressing

## 2021-06-24 NOTE — Lactation Note (Signed)
This note was copied from a baby's chart. Lactation Consultation Note  Patient Name: Meghan Mosley [id number] Date: 06/24/2021 Reason for consult: Follow-up assessment;Term;Primapara;1st time breastfeeding Age:33 hours   P1 mother whose infant is now 87 hours old.  This is a term baby at 40+0 weeks.  Mother's current feeding preference is breast.  RN requested latch assistance.  Nursery RN in room to give vaccinations.  Taught hand expression; no drops noted at this time.  Assisted to latch in the football hold to the right breast.  Latch achieved; no suck.  Placed "Sam" on the left breast and he was able to latch and breast fed for 10 minutes with intermittent gentle stimulation.  Demonstrated breast compressions.  Reviewed breast feeding basics.  Provided breast shells and a manual pump to help evert nipple on the right breast.  #24 flange size is appropriate.  Encouraged mother to call for further latch assistance as needed.    Father and grandparents present and very supportive.  Family assisting with newborn care.   Maternal Data Has patient been taught Hand Expression?: Yes Does the patient have breastfeeding experience prior to this delivery?: No  Feeding Mother's Current Feeding Choice: Breast Milk  LATCH Score Latch: Repeated attempts needed to sustain latch, nipple held in mouth throughout feeding, stimulation needed to elicit sucking reflex.  Audible Swallowing: None  Type of Nipple: Everted at rest and after stimulation (Everted on left; very short shafted on right)  Comfort (Breast/Nipple): Soft / non-tender  Hold (Positioning): Assistance needed to correctly position infant at breast and maintain latch.  LATCH Score: 6   Lactation Tools Discussed/Used    Interventions Interventions: Breast feeding basics reviewed;Assisted with latch;Skin to skin;Breast massage;Hand express;Breast compression;Hand pump;Position options;Support pillows;Adjust position;Education;LC  Services brochure  Discharge Pump: Personal;Manual WIC Program: No  Consult Status Consult Status: Follow-up Date: 06/25/21 Follow-up type: In-patient    Khoen Genet R Cortina Vultaggio 06/24/2021, 10:30 AM

## 2021-06-24 NOTE — Anesthesia Postprocedure Evaluation (Signed)
Anesthesia Post Note  Patient: Meghan Mosley  Procedure(s) Performed: AN AD HOC LABOR EPIDURAL     Anesthesia Post Evaluation No notable events documented.  Last Vitals:  Vitals:   06/24/21 0832 06/24/21 0927  BP: 125/65 119/66  Pulse: 96 92  Resp: 16 17  Temp: 36.6 C 36.5 C  SpO2: 98% 99%    Last Pain:  Vitals:   06/24/21 0927  TempSrc: Axillary  PainSc: 0-No pain   Pain Goal: Patients Stated Pain Goal: 0 (06/23/21 0506)              Epidural/Spinal Function Cutaneous sensation: Normal sensation (06/24/21 0927), Patient able to flex knees: Yes (06/24/21 0927), Patient able to lift hips off bed: Yes (06/24/21 0927), Back pain beyond tenderness at insertion site: No (06/24/21 0927), Progressively worsening motor and/or sensory loss: No (06/24/21 0927), Bowel and/or bladder incontinence post epidural: No (06/24/21 0927)  Lucinda Dell

## 2021-06-24 NOTE — Lactation Note (Signed)
This note was copied from a baby's chart. Lactation Consultation Note  Patient Name: Meghan Mosley [id number] Date: 06/24/2021 Reason for consult: Initial assessment;1st time breastfeeding;Primapara;Term Age:33 hours   P1 mother whose infant is now 39 hours old.  This is a term baby at 40+0 weeks.  Mother's current feeding preference is breast/formula.  Baby "Sam" was asleep in grandmother's arms when I arrived.  Grandmother informed me that he has breast fed twice since delivery and she was able to notice colostrum with the feedings.  Mother has been leaking some colostrum.  Encouraged lots of STS and reviewed breast feeding basics.  Mother will need to be taught hand expression; did not teach at this time due to visitors being present.  Offered to return at the next feeding for latch assistance.  Mother will call when ready.  Allowed time for family bonding and for all to have breakfast together.  RN updated.   Maternal Data Has patient been taught Hand Expression?: No (Visitors in room; please follow up with the next latch assist) Does the patient have breastfeeding experience prior to this delivery?: No  Feeding Mother's Current Feeding Choice: Breast Milk and Formula  LATCH Score                    Lactation Tools Discussed/Used    Interventions Interventions: Breast feeding basics reviewed;Education  Discharge Pump: Personal  Consult Status Consult Status: Follow-up Date: 06/25/21 Follow-up type: In-patient    Adrain Butrick R Kennan Detter 06/24/2021, 9:10 AM

## 2021-06-25 ENCOUNTER — Encounter (HOSPITAL_COMMUNITY): Payer: Self-pay | Admitting: Obstetrics and Gynecology

## 2021-06-25 MED ORDER — ACETAMINOPHEN 325 MG PO TABS: 650.0000 mg | ORAL_TABLET | Freq: Four times a day (QID) | ORAL | 0 refills | Status: DC | PRN

## 2021-06-25 MED ORDER — IBUPROFEN 600 MG PO TABS
600.0000 mg | ORAL_TABLET | Freq: Four times a day (QID) | ORAL | 0 refills | Status: DC | PRN
Start: 2021-06-25 — End: 2022-01-10

## 2021-06-25 NOTE — Progress Notes (Signed)
Post Partum Day 1 Subjective: no complaints, up ad lib, voiding, tolerating PO, and + flatus Feels good Objective: Blood pressure 124/61, pulse 73, temperature 97.9 F (36.6 C), temperature source Oral, resp. rate 16, height 5\' 3"  (1.6 m), weight 98.9 kg, SpO2 98 %, unknown if currently breastfeeding.  Physical Exam:  General: alert, cooperative, and no distress Lochia: appropriate Uterine Fundus: firm Incision: healing well DVT Evaluation: No evidence of DVT seen on physical exam.  Recent Labs    06/24/21 0657 06/24/21 1649  HGB 12.2 11.1*  HCT 36.3 33.3*   Results for orders placed or performed during the hospital encounter of 06/23/21 (from the past 24 hour(s))  CBC     Status: Abnormal   Collection Time: 06/24/21  4:49 PM  Result Value Ref Range   WBC 19.7 (H) 4.0 - 10.5 K/uL   RBC 3.88 3.87 - 5.11 MIL/uL   Hemoglobin 11.1 (L) 12.0 - 15.0 g/dL   HCT 14/10/22 (L) 56.4 - 33.2 %   MCV 85.8 80.0 - 100.0 fL   MCH 28.6 26.0 - 34.0 pg   MCHC 33.3 30.0 - 36.0 g/dL   RDW 95.1 88.4 - 16.6 %   Platelets 236 150 - 400 K/uL   nRBC 0.0 0.0 - 0.2 %  Comprehensive metabolic panel     Status: Abnormal   Collection Time: 06/24/21  4:49 PM  Result Value Ref Range   Sodium 136 135 - 145 mmol/L   Potassium 3.7 3.5 - 5.1 mmol/L   Chloride 106 98 - 111 mmol/L   CO2 22 22 - 32 mmol/L   Glucose, Bld 157 (H) 70 - 99 mg/dL   BUN 11 6 - 20 mg/dL   Creatinine, Ser 14/10/22 0.44 - 1.00 mg/dL   Calcium 7.3 (L) 8.9 - 10.3 mg/dL   Total Protein 5.4 (L) 6.5 - 8.1 g/dL   Albumin 2.5 (L) 3.5 - 5.0 g/dL   AST 22 15 - 41 U/L   ALT 14 0 - 44 U/L   Alkaline Phosphatase 111 38 - 126 U/L   Total Bilirubin 0.4 0.3 - 1.2 mg/dL   GFR, Estimated 0.16 >01 mL/min   Anion gap 8 5 - 15  Magnesium     Status: Abnormal   Collection Time: 06/24/21  4:49 PM  Result Value Ref Range   Magnesium 4.6 (H) 1.7 - 2.4 mg/dL      Assessment/Plan: Plan for discharge tomorrow BP normal, has required no treatment D/W D/C  instructions in case baby is ready to go later today BP check this week D/W circumcision newborn boy. Risks reviewed. She states she understands and agrees.   LOS: 2 days   14/10/22 II 06/25/2021, 10:54 AM

## 2021-06-25 NOTE — Discharge Summary (Signed)
Postpartum Discharge Summary  Date of Service updated 06/25/21     Patient Name: Meghan Mosley DOB: [birth year] MRN: [id number]  Date of admission: 06/23/2021 Delivery date:06/24/2021  Delivering provider: Everlene Farrier  Date of discharge: 06/25/2021  Admitting diagnosis: Pregnancy induced hypertension [O13.9] Intrauterine pregnancy: [redacted]w[redacted]d    Secondary diagnosis:  Principal Problem:   Pregnancy induced hypertension  Additional problems:     Discharge diagnosis: Term Pregnancy Delivered and Gestational Hypertension                                              Post partum procedures: Augmentation: AROM and Pitocin Complications:   Hospital course: Onset of Labor With Vaginal Delivery      35y.o. yo G1P1001 at 438w0das admitted in Latent Labor on 06/23/2021. Patient had an uncomplicated labor course as follows:  Membrane Rupture Time/Date: 5:29 PM ,06/23/2021   Delivery Method:Vaginal, Spontaneous  Episiotomy: None  Lacerations:  Periurethral  Patient had an uncomplicated postpartum course.  She is ambulating, tolerating a regular diet, passing flatus, and urinating well. Patient is discharged home in stable condition on 06/25/21.  Newborn Data: Birth date:06/24/2021  Birth time:5:40 AM  Gender:Female  Living status:Living  Apgars:9 ,9  Weight:3515 g   Magnesium Sulfate received: Yes: Seizure prophylaxis BMZ received: No Rhophylac:No MMR:No T-DaP:Given prenatally Flu: No Transfusion:No  Physical exam  Vitals:   06/25/21 0554 06/25/21 0739 06/25/21 0740 06/25/21 1200  BP: 125/72 124/61  126/63  Pulse: 73 73  81  Resp:  16  16  Temp:  97.9 F (36.6 C)  98.3 F (36.8 C)  TempSrc:  Oral  Oral  SpO2:  100% 98% 99%  Weight:      Height:       General: alert, cooperative, and no distress Lochia: appropriate Uterine Fundus: firm Incision: Healing well with no significant drainage DVT Evaluation: No evidence of DVT seen on physical exam. Labs: Lab Results   Component Value Date   WBC 19.7 (H) 06/24/2021   HGB 11.1 (L) 06/24/2021   HCT 33.3 (L) 06/24/2021   MCV 85.8 06/24/2021   PLT 236 06/24/2021   CMP Latest Ref Rng & Units 06/24/2021  Glucose 70 - 99 mg/dL 157(H)  BUN 6 - 20 mg/dL 11  Creatinine 0.44 - 1.00 mg/dL 0.84  Sodium 135 - 145 mmol/L 136  Potassium 3.5 - 5.1 mmol/L 3.7  Chloride 98 - 111 mmol/L 106  CO2 22 - 32 mmol/L 22  Calcium 8.9 - 10.3 mg/dL 7.3(L)  Total Protein 6.5 - 8.1 g/dL 5.4(L)  Total Bilirubin 0.3 - 1.2 mg/dL 0.4  Alkaline Phos 38 - 126 U/L 111  AST 15 - 41 U/L 22  ALT 0 - 44 U/L 14   Edinburgh Score: Edinburgh Postnatal Depression Scale Screening Tool 06/24/2021  I have been able to laugh and see the funny side of things. (No Data)      After visit meds:  Allergies as of 06/25/2021       Reactions   Gluten Meal Diarrhea, Nausea And Vomiting   Pineapple    Tramadol Diarrhea, Nausea And Vomiting        Medication List     STOP taking these medications    azelastine 0.1 % nasal spray Commonly known as: ASTELIN   benzonatate 200 MG capsule Commonly known as: TESSALON  FLUoxetine 20 MG tablet Commonly known as: PROZAC   predniSONE 20 MG tablet Commonly known as: DELTASONE       TAKE these medications    acetaminophen 325 MG tablet Commonly known as: TYLENOL Take 2 tablets (650 mg total) by mouth every 6 (six) hours as needed (for pain scale < 4  OR  temperature  >/=  100.5 F).   ibuprofen 600 MG tablet Commonly known as: ADVIL Take 1 tablet (600 mg total) by mouth every 6 (six) hours as needed.         Discharge home in stable condition Infant Feeding: Breast Infant Disposition:home with mother Discharge instruction: per After Visit Summary and Postpartum booklet. Activity: Advance as tolerated. Pelvic rest for 6 weeks.  Diet: routine diet Anticipated Birth Control: Unsure Postpartum Appointment:6 weeks Additional Postpartum F/U: BP check 1 week Future  Appointments:No future appointments. Follow up Visit:      06/25/2021 Allena Katz, MD

## 2021-06-25 NOTE — Plan of Care (Signed)
  Problem: Education: Goal: Knowledge of General Education information will improve Description: Including pain rating scale, medication(s)/side effects and non-pharmacologic comfort measures Outcome: Adequate for Discharge   Problem: Health Behavior/Discharge Planning: Goal: Ability to manage health-related needs will improve Outcome: Adequate for Discharge   Problem: Clinical Measurements: Goal: Ability to maintain clinical measurements within normal limits will improve Outcome: Adequate for Discharge Goal: Will remain free from infection Outcome: Adequate for Discharge Goal: Diagnostic test results will improve Outcome: Adequate for Discharge Goal: Respiratory complications will improve Outcome: Adequate for Discharge Goal: Cardiovascular complication will be avoided Outcome: Adequate for Discharge   Problem: Activity: Goal: Risk for activity intolerance will decrease Outcome: Adequate for Discharge   Problem: Nutrition: Goal: Adequate nutrition will be maintained Outcome: Adequate for Discharge   Problem: Coping: Goal: Level of anxiety will decrease Outcome: Adequate for Discharge   Problem: Elimination: Goal: Will not experience complications related to bowel motility Outcome: Adequate for Discharge Goal: Will not experience complications related to urinary retention Outcome: Adequate for Discharge   Problem: Pain Managment: Goal: General experience of comfort will improve Outcome: Adequate for Discharge   Problem: Safety: Goal: Ability to remain free from injury will improve Outcome: Adequate for Discharge   Problem: Skin Integrity: Goal: Risk for impaired skin integrity will decrease Outcome: Adequate for Discharge   Problem: Education: Goal: Knowledge of condition will improve Outcome: Adequate for Discharge Goal: Individualized Educational Video(s) Outcome: Adequate for Discharge Goal: Individualized Newborn Educational Video(s) Outcome: Adequate for  Discharge   Problem: Activity: Goal: Will verbalize the importance of balancing activity with adequate rest periods Outcome: Adequate for Discharge Goal: Ability to tolerate increased activity will improve Outcome: Adequate for Discharge   Problem: Coping: Goal: Ability to identify and utilize available resources and services will improve Outcome: Adequate for Discharge   Problem: Life Cycle: Goal: Chance of risk for complications during the postpartum period will decrease Outcome: Adequate for Discharge   Problem: Role Relationship: Goal: Ability to demonstrate positive interaction with newborn will improve Outcome: Adequate for Discharge   Problem: Skin Integrity: Goal: Demonstration of wound healing without infection will improve Outcome: Adequate for Discharge   Problem: Education: Goal: Knowledge of General Education information will improve Description: Including pain rating scale, medication(s)/side effects and non-pharmacologic comfort measures Outcome: Adequate for Discharge   Problem: Health Behavior/Discharge Planning: Goal: Ability to manage health-related needs will improve Outcome: Adequate for Discharge   Problem: Clinical Measurements: Goal: Ability to maintain clinical measurements within normal limits will improve Outcome: Adequate for Discharge Goal: Will remain free from infection Outcome: Adequate for Discharge Goal: Diagnostic test results will improve Outcome: Adequate for Discharge Goal: Respiratory complications will improve Outcome: Adequate for Discharge Goal: Cardiovascular complication will be avoided Outcome: Adequate for Discharge   Problem: Activity: Goal: Risk for activity intolerance will decrease Outcome: Adequate for Discharge   Problem: Nutrition: Goal: Adequate nutrition will be maintained Outcome: Adequate for Discharge   Problem: Coping: Goal: Level of anxiety will decrease Outcome: Adequate for Discharge   Problem:  Elimination: Goal: Will not experience complications related to bowel motility Outcome: Adequate for Discharge Goal: Will not experience complications related to urinary retention Outcome: Adequate for Discharge   Problem: Pain Managment: Goal: General experience of comfort will improve Outcome: Adequate for Discharge   Problem: Safety: Goal: Ability to remain free from injury will improve Outcome: Adequate for Discharge   Problem: Skin Integrity: Goal: Risk for impaired skin integrity will decrease Outcome: Adequate for Discharge

## 2021-06-25 NOTE — Lactation Note (Addendum)
This note was copied from a baby's chart. Lactation Consultation Note  Patient Name: Meghan Mosley IRCVE'L Date: 06/25/2021 Reason for consult: Follow-up assessment;Primapara;1st time breastfeeding;Term;Nipple pain/trauma;Infant weight loss;Other (Comment) (4 % weight loss / post circ / using a #24 NS/) Age:33 hours Per mom and RN baby has been sleepy since circ/ attempts to feed and its been 4 hours.  LC offered to try to wake baby up, baby more awake, after stimulation , burping , having the baby suck on a gloved finger , and latched easily on a #24 NS with 3ml of formula appetizer. Baby opened wide for the latch with flanged lips.  Baby sucked the entire 4 ml and fed 3 mins and off / fell back to sleep.  \mom and dad aware of post circ sleepiness / due to tylenol . 30 ml bottle fixed and need to use with within the 60 mins pace feeding as directed.   LC provided a written plan in steps :  Comfort gels x 6 days / after pumping  Breast shells while awake between feedings - use nipple balm  Prior to latching - breast massage, hand express, pre-pump with  hand pump ( 10-20 strokes ) ,  Apply #24 NS / instill the EBM or formula in the top  Latch the baby with firm support as shown and when latching wait for baby to open wide.  Have dad or grandmother check the lip lines for flanged lips.  Feed for 15 -20 mins - 30 mins max - supplement 30 ml  Post pump both breast for 15 mins - save milk for the next feeding.  Once milk comes in if offering both breast for feeding don't have to post pump.  Sore nipple and engorgement prevention and tx reviewed.  Mom and dad receptive to coming back for F/U with [facility] . Mom aware she will receive a phone call to set up appt.   Maternal Data    Feeding Mother's Current Feeding Choice: Breast Milk and Formula  LATCH Score Latch: Grasps breast easily, tongue down, lips flanged, rhythmical sucking.  Audible Swallowing: Spontaneous and  intermittent (with formula appetizer)  Type of Nipple: Everted at rest and after stimulation (areaola edema / shells are helping)  Comfort (Breast/Nipple): Filling, red/small blisters or bruises, mild/mod discomfort  Hold (Positioning): Assistance needed to correctly position infant at breast and maintain latch.  LATCH Score: 8   Lactation Tools Discussed/Used Tools: Shells;Pump;Flanges;Nipple Shields;Comfort gels;Bottle Nipple shield size: 24 Breast pump type: Manual;Double-Electric Breast Pump (was already being used) Pump Education: Milk Storage  Interventions Interventions: Breast feeding basics reviewed;Skin to skin;Assisted with latch;Breast massage;Hand express;Reverse pressure;Adjust position;Support pillows;Position options;Shells;Hand pump;DEBP;Education;Pace feeding;LC Services brochure  Discharge Discharge Education: Warning signs for feeding baby;Engorgement and breast care Pump: Personal;Manual;DEBP  Consult Status Consult Status: Follow-up Date: 06/25/21 Follow-up type: In-patient    Meghan Mosley 06/25/2021, 3:13 PM

## 2021-06-25 NOTE — Discharge Instructions (Signed)
Call office for BP check this week

## 2021-06-27 ENCOUNTER — Inpatient Hospital Stay (HOSPITAL_COMMUNITY): Admission: AD | Admit: 2021-06-27 | Payer: 59 | Source: Home / Self Care | Admitting: Obstetrics and Gynecology

## 2021-06-27 ENCOUNTER — Inpatient Hospital Stay (HOSPITAL_COMMUNITY): Payer: 59

## 2021-06-27 LAB — SURGICAL PATHOLOGY

## 2021-07-07 ENCOUNTER — Telehealth (HOSPITAL_COMMUNITY): Payer: Self-pay | Admitting: *Deleted

## 2021-07-07 NOTE — Telephone Encounter (Signed)
Hospital Discharge Follow-Up Call:  Patient reports that she is doing well and has no concerns about her healing process.  EPDS today was 6 and she endorses that overall she is doing well emotionally.  She reports being tearful at times and acknowledges the "learning curve" of caring for a newborn, but that she is mostly happy and coping well.  Patient says that baby is well and she has no concerns about baby's health.  She reports that baby sleeps in a bassinet.  ABCs of Safe Sleep reviewed.

## 2021-12-04 ENCOUNTER — Encounter: Payer: Self-pay | Admitting: Family Medicine

## 2021-12-04 ENCOUNTER — Telehealth (INDEPENDENT_AMBULATORY_CARE_PROVIDER_SITE_OTHER): Payer: Self-pay | Admitting: Family Medicine

## 2021-12-04 VITALS — Temp 99.1°F | Ht 63.0 in | Wt 199.0 lb

## 2021-12-04 DIAGNOSIS — J208 Acute bronchitis due to other specified organisms: Secondary | ICD-10-CM

## 2021-12-04 NOTE — Progress Notes (Signed)
     Virtual Visit via Video Note  Subjective  CC:  Chief Complaint  Patient presents with   Sinus Problem    Pt has been feeling bad x1 week and it has gottren worse over time and moving into the chest. Pt stated that she had a low grade fever yesterday.     I connected with [name] on 12/04/21 at  4:15 PM EDT by a video enabled telemedicine application and verified that I am speaking with the correct person using two identifiers. Location patient: Home Location provider: [facility] [address], Office Persons participating in the virtual visit: Denece Shearer, Willow Ora, MD Trudie Reed CMA  I discussed the limitations of evaluation and management by telemedicine and the availability of in person appointments. The patient expressed understanding and agreed to proceed. HPI: Meghan Mosley is a 36 y.o. female who was contacted today to address the problems listed above in the chief complaint. Bronchitis like sxs: st, congestion, sinus congestion, and hacking cough with myaglias. +low grade fevers. No asthma. Son home with same. Negative covid this am. Nl appetite and breathing.   Assessment  1. Acute bronchitis, viral      Plan  Viral bronchitis:  education and counseling given. Otc supportive care. Discussed sxs of bacterial sinus infection or bacterial bronchitis: she will message me if sxs worsen or don't resolve. Will avoid abx for now.   I discussed the assessment and treatment plan with the patient. The patient was provided an opportunity to ask questions and all were answered. The patient agreed with the plan and demonstrated an understanding of the instructions.   The patient was advised to call back or seek an in-person evaluation if the symptoms worsen or if the condition fails to improve as anticipated. Follow up: prn  Visit date not found  No orders of the defined types were placed in this encounter.     I reviewed the patients updated  PMH, FH, and SocHx.    Patient Active Problem List   Diagnosis Date Noted   Pregnancy induced hypertension 06/23/2021   Pediatric pre-birth visit for expectant parent 04/06/2021   Current Meds  Medication Sig   acetaminophen (TYLENOL) 325 MG tablet Take 2 tablets (650 mg total) by mouth every 6 (six) hours as needed (for pain scale < 4  OR  temperature  >/=  100.5 F).   SPRINTEC 28 0.25-35 MG-MCG tablet Take 1 tablet by mouth daily.    Allergies: Patient is allergic to gluten meal, pineapple, and tramadol. Family History: Patient family history includes Breast cancer in her paternal grandmother; Diabetes in an other family member; Fibroids in an other family member; Heart attack in her maternal grandfather; Heart disease in her mother; Hypertension in her maternal grandfather, mother, and paternal grandfather. Social History:  Patient  reports that she has never smoked. She has never used smokeless tobacco. She reports that she does not currently use alcohol. She reports that she does not use drugs.  Review of Systems: Constitutional: Negative for fever malaise or anorexia Cardiovascular: negative for chest pain Respiratory: negative for SOB or persistent cough Gastrointestinal: negative for abdominal pain  OBJECTIVE Vitals: Temp 99.1 F (37.3 C)   Ht 5\' 3"  (1.6 m)   Wt 199 lb (90.3 kg)   BMI 35.25 kg/m  General: no acute distress , A&Ox3 Nasal congestion. Nontoxic. No respiratory distress , MD

## 2021-12-05 ENCOUNTER — Encounter: Payer: Self-pay | Admitting: Family Medicine

## 2021-12-05 ENCOUNTER — Telehealth: Payer: Self-pay

## 2021-12-05 NOTE — Telephone Encounter (Signed)
Patient states she was to have a letter uploaded to her mychart to excuse her from work today.   Patient states she has not received the letter in her mychart.  Is requesting call back in regard.

## 2021-12-05 NOTE — Telephone Encounter (Signed)
Note sent to MyChart.

## 2022-01-10 ENCOUNTER — Ambulatory Visit: Payer: 59 | Admitting: Family Medicine

## 2022-01-10 ENCOUNTER — Encounter: Payer: Self-pay | Admitting: Family Medicine

## 2022-01-10 VITALS — BP 142/88 | HR 90 | Temp 97.4°F | Ht 63.0 in | Wt 198.2 lb

## 2022-01-10 DIAGNOSIS — R109 Unspecified abdominal pain: Secondary | ICD-10-CM | POA: Insufficient documentation

## 2022-01-10 LAB — COMPREHENSIVE METABOLIC PANEL
ALT: 14 U/L (ref 0–35)
AST: 15 U/L (ref 0–37)
Albumin: 4.3 g/dL (ref 3.5–5.2)
Alkaline Phosphatase: 82 U/L (ref 39–117)
BUN: 14 mg/dL (ref 6–23)
CO2: 27 mEq/L (ref 19–32)
Calcium: 9.8 mg/dL (ref 8.4–10.5)
Chloride: 102 mEq/L (ref 96–112)
Creatinine, Ser: 0.79 mg/dL (ref 0.40–1.20)
GFR: 98.06 mL/min (ref >60.00)
Glucose, Bld: 122 mg/dL — ABNORMAL HIGH (ref 70–99)
Potassium: 4.4 mEq/L (ref 3.5–5.1)
Sodium: 139 mEq/L (ref 135–145)
Total Bilirubin: 0.3 mg/dL (ref 0.2–1.2)
Total Protein: 7.9 g/dL (ref 6.0–8.3)

## 2022-01-10 LAB — LIPASE: Lipase: 32 U/L (ref 11.0–59.0)

## 2022-01-10 MED ORDER — FAMOTIDINE 20 MG PO TABS: 20.0000 mg | ORAL_TABLET | Freq: Two times a day (BID) | ORAL | 1 refills | Status: DC

## 2022-01-10 NOTE — Progress Notes (Signed)
Subjective:     Meghan Mosley is a 36 y.o. female who presents for evaluation of abdominal pain. Onset was 5 day ago. Symptoms have been unchanged. The pain is described as sharp, and is  severe / intensity. Pain is located in the RUQ and epigastric region without radiation.  Aggravating factors: eating.  Alleviating factors: not eating. Associated symptoms: nausea. The patient denies diarrhea, fever, headache, hematochezia, and vomiting.  The patient's history has been marked as reviewed and updated as appropriate.  Review of Systems Pertinent items noted in HPI and remainder of comprehensive ROS otherwise negative.     Objective:   BP (!) 142/88 (BP Location: Left Arm, Patient Position: Sitting, Cuff Size: Large)   Pulse 90   Temp (!) 97.4 F (36.3 C) (Temporal)   Ht '5\' 3"'  (1.6 m)   Wt 198 lb 3.2 oz (89.9 kg)   LMP 12/15/2021   SpO2 98%   BMI 35.11 kg/m   General: No acute distress. Awake and conversant.  Eyes: Normal conjunctiva, anicteric. Round symmetric pupils.  ENT: Hearing grossly intact. No nasal discharge.  Neck: Neck is supple. No masses or thyromegaly.  Respiratory: Respirations are non-labored. No auditory wheezing.  Skin: Warm. No rashes or ulcers.  Psych: Alert and oriented. Cooperative, Appropriate mood and affect, Normal judgment.  CV: No cyanosis or JVD MSK: Normal ambulation. No clubbing  Neuro: CN II-XII grossly normal, no tremor  ABD: RUQ tenderness and epigastric tenderness    Assessment and plan:   Problem List Items Addressed This Visit       Other   Abdominal pain - Primary    Epigastric and right upper quadrant Likely GERD, cannot rule out gallstones, less likely pancreatitis or heart disease CMP, lipase, limited abdominal ultrasound Return ED precautions discussed      Relevant Medications   famotidine (PEPCID) 20 MG tablet   Other Relevant Orders   Lipase   Comp Met (CMET)   US Abdomen Limited RUQ (LIVER/GB)

## 2022-01-10 NOTE — Assessment & Plan Note (Signed)
Epigastric and right upper quadrant Likely GERD, cannot rule out gallstones, less likely pancreatitis or heart disease CMP, lipase, limited abdominal ultrasound Return ED precautions discussed

## 2022-01-15 ENCOUNTER — Ambulatory Visit
Admission: RE | Admit: 2022-01-15 | Discharge: 2022-01-15 | Disposition: A | Discharge status: Home / Self Care | Payer: 59 | Source: Ambulatory Visit | Attending: Family Medicine | Admitting: Family Medicine

## 2022-03-01 ENCOUNTER — Telehealth: Payer: Self-pay | Admitting: Registered Nurse

## 2022-03-01 NOTE — Telephone Encounter (Signed)
Pt would like  TOC to dr banks richard morrow no longer at [facility] and we are closer

## 2022-03-08 NOTE — Telephone Encounter (Signed)
Pt is calling to check on TOC . Pt is aware waiting on md

## 2022-04-06 NOTE — Telephone Encounter (Signed)
Ok

## 2022-04-09 NOTE — Telephone Encounter (Signed)
Pt has sch TOC with provider at [facility]

## 2022-04-23 ENCOUNTER — Encounter: Payer: Self-pay | Admitting: Family Medicine

## 2022-04-23 ENCOUNTER — Ambulatory Visit: Payer: 59 | Admitting: Family Medicine

## 2022-04-23 VITALS — BP 126/84 | HR 78 | Temp 97.6°F | Wt 198.6 lb

## 2022-04-23 DIAGNOSIS — F419 Anxiety disorder, unspecified: Secondary | ICD-10-CM | POA: Diagnosis not present

## 2022-04-23 DIAGNOSIS — Z23 Encounter for immunization: Secondary | ICD-10-CM | POA: Diagnosis not present

## 2022-04-23 DIAGNOSIS — O99345 Other mental disorders complicating the puerperium: Secondary | ICD-10-CM

## 2022-04-23 DIAGNOSIS — Z87891 Personal history of nicotine dependence: Secondary | ICD-10-CM | POA: Diagnosis not present

## 2022-04-23 DIAGNOSIS — R221 Localized swelling, mass and lump, neck: Secondary | ICD-10-CM | POA: Insufficient documentation

## 2022-04-23 HISTORY — DX: Other mental disorders complicating the puerperium: O99.345

## 2022-04-23 NOTE — Progress Notes (Signed)
Assessment/Plan:  Spent 30 minutes.  Patient note and discussing history Problem List Items Addressed This Visit       Other   Neck mass    Painless, growing History of smoking Differential includes traumatic, infectious, reactive, inflammatory etiology, also cannot rule out malignancy Given age, malignancy is less likely Neck ultrasound Follow-up in 1 month to reevaluate Low threshold to refer to ENT for further evaluation      Relevant Orders   US Soft Tissue Head/Neck (NON-THYROID)   Former smoker   Anxiety    Worsening, although symptoms are mild Likely worse given recent life changes with heavy child, improving stopping hormonal therapy Patient pursuing counseling does not wish to pursue medication at this time Patient follow-up if no improvement      Other Visit Diagnoses     Need for influenza vaccination    -  Primary   Relevant Orders   Flu Vaccine QUAD 6+ mos PF IM (Fluarix Quad PF) (Completed)          Subjective:  HPI:  Meghan Mosley is a 36 y.o. female who has Pediatric pre-birth visit for expectant parent; Pregnancy induced hypertension; Abdominal pain; Neck mass; Former smoker; and Anxiety on their problem list..   She  has a past medical history of Medical history non-contributory..   She presents with chief complaint of Establish Care (Spot of concern on back of neck size of dime.  Patient would like to about b/p and anxiety as well. ) .     Neck lump.  Patient presents with 1 year history of posterior neck lump.  It appeared all of a sudden.  There is no known inciting event such as a cold or injury.  It is noted gated at the posterior nape of the neck on the right side.  It started off as "pinhead sized".  Patient initially thought it was a acne bump.  She tried to express it but was unable to get anything out of it.  Over the past year it is slowly grown to about the size of a "dime".  Is not painful, or itching and without drainage.  Patient  is a former smoker.  She started when she was 20 and smoked regularly for 1 year.  She then reports that she quit and only smoked " socially" when out at a bar or drinking with friends.  Reports that the last cigarette was in 2018.  Also reports significant history of secondhand smoke in childhood from parents.  Patient denies any fevers or chills, rash, night sweats, weight loss.    Anxiety,  Chronic Current Medications: none, previously on prozac, couseling worked in the past Current Symptoms/Interim History:   She has had at anxiety least for the past 3+ years.  Patient has been on fluoxetine 20 mg in the past.  But this was discontinued in 2021.  Patient has been going to counseling which did help symptoms.  Reports worsening anxiety since having a child 10 months ago.  Patient had been on OCPs prescribed but by her OB/GYN, as worsening anxiety was felt to be postpartum related.  Patient however was recently discontinued off OCPs approximately 3 weeks ago due to lack of improvement in mood.  Patient reports improvement in anxiety since the OCP discontinuation.  Patient has upcoming counseling appointment scheduled.     04/23/2022    2:18 PM  Depression screen PHQ 2/9  Decreased Interest 0  Down, Depressed, Hopeless 1  PHQ - 2 Score  1  Altered sleeping 0  Tired, decreased energy 1  Change in appetite 1  Feeling bad or failure about yourself  1  Trouble concentrating 0  Moving slowly or fidgety/restless 0  Suicidal thoughts 0  PHQ-9 Score 4  Difficult doing work/chores Somewhat difficult       04/23/2022    2:18 PM  GAD 7 : Generalized Anxiety Score  Nervous, Anxious, on Edge 1  Control/stop worrying 2  Worry too much - different things 1  Trouble relaxing 1  Restless 0  Easily annoyed or irritable 1  Afraid - awful might happen 1  Total GAD 7 Score 7  Anxiety Difficulty Somewhat difficult    ROS: No SI or HI.   Past Surgical History:  Procedure Laterality Date    MOUTH SURGERY  2008   wisdom teeth, have re grown    Outpatient Medications Prior to Visit  Medication Sig Dispense Refill   famotidine (PEPCID) 20 MG tablet Take 1 tablet (20 mg total) by mouth 2 (two) times daily. 60 tablet 1   SPRINTEC 28 0.25-35 MG-MCG tablet Take 1 tablet by mouth daily.     No facility-administered medications prior to visit.    Family History  Problem Relation Age of Onset   Hypertension Mother    Heart disease Mother    Breast cancer Mother    Hypertension Maternal Grandfather    Heart attack Maternal Grandfather    Breast cancer Paternal Grandmother    Hypertension Paternal Grandfather    Diabetes Other    Fibroids Other     Social History   Socioeconomic History   Marital status: Married    Spouse name: Not on file   Number of children: Not on file   Years of education: Not on file   Highest education level: Not on file  Occupational History   Not on file  Tobacco Use   Smoking status: Never    Passive exposure: Never   Smokeless tobacco: Never  Vaping Use   Vaping Use: Never used  Substance and Sexual Activity   Alcohol use: Not Currently    Comment: social   Drug use: No   Sexual activity: Yes  Other Topics Concern   Not on file  Social History Narrative   Not on file   Social Determinants of Health   Financial Resource Strain: Not on file  Food Insecurity: Not on file  Transportation Needs: Not on file  Physical Activity: Not on file  Stress: Not on file  Social Connections: Not on file  Intimate Partner Violence: Not on file                                                                                                 Objective:  Physical Exam: BP 126/84 (BP Location: Left Arm, Patient Position: Sitting, Cuff Size: Large)   Pulse 78   Temp 97.6 F (36.4 C) (Temporal)   Wt 198 lb 9.6 oz (90.1 kg)   LMP 04/22/2022   SpO2 98%   Breastfeeding No   BMI 35.18 kg/m    General: No  acute distress. Awake and  conversant.  Eyes: Normal conjunctiva, anicteric. Round symmetric pupils.  ENT: Hearing grossly intact. No nasal discharge.  Neck: There is a nontender subcutaneous nodule on the right posterior aspect of the neck at the nape right within the hairline, is approximately 1 cm in diameter, is nontender, there is no drainage or over lying erythema or rash, it is not fluctuant Respiratory: Respirations are non-labored. No auditory wheezing.  Skin: Warm. No rashes or ulcers.  Psych: Alert and oriented. Cooperative, Appropriate mood and affect, Normal judgment.  CV: No cyanosis or JVD MSK: Normal ambulation. No clubbing  Neuro: Sensation and CN II-XII grossly normal.        Garner Nash, MD, MS

## 2022-04-23 NOTE — Assessment & Plan Note (Signed)
Painless, growing History of smoking Differential includes traumatic, infectious, reactive, inflammatory etiology, also cannot rule out malignancy Given age, malignancy is less likely Neck ultrasound Follow-up in 1 month to reevaluate Low threshold to refer to ENT for further evaluation

## 2022-04-23 NOTE — Assessment & Plan Note (Signed)
Worsening, although symptoms are mild Likely worse given recent life changes with heavy child, improving stopping hormonal therapy Patient pursuing counseling does not wish to pursue medication at this time Patient follow-up if no improvement

## 2022-04-23 NOTE — Patient Instructions (Signed)
For neck nodule, we are getting an ultrasound.

## 2022-05-09 ENCOUNTER — Encounter (HOSPITAL_BASED_OUTPATIENT_CLINIC_OR_DEPARTMENT_OTHER): Payer: Self-pay

## 2022-05-09 ENCOUNTER — Ambulatory Visit (HOSPITAL_BASED_OUTPATIENT_CLINIC_OR_DEPARTMENT_OTHER): Payer: 59

## 2022-05-09 ENCOUNTER — Telehealth (HOSPITAL_BASED_OUTPATIENT_CLINIC_OR_DEPARTMENT_OTHER): Payer: Self-pay

## 2022-05-21 ENCOUNTER — Ambulatory Visit: Payer: 59 | Admitting: Family Medicine

## 2022-06-18 ENCOUNTER — Ambulatory Visit: Payer: 59 | Admitting: Nurse Practitioner

## 2022-06-18 ENCOUNTER — Encounter: Payer: Self-pay | Admitting: Nurse Practitioner

## 2022-06-18 VITALS — BP 142/86 | HR 91 | Temp 97.7°F | Ht 63.0 in | Wt 197.8 lb

## 2022-06-18 DIAGNOSIS — J069 Acute upper respiratory infection, unspecified: Secondary | ICD-10-CM | POA: Diagnosis not present

## 2022-06-18 DIAGNOSIS — Z20828 Contact with and (suspected) exposure to other viral communicable diseases: Secondary | ICD-10-CM

## 2022-06-18 LAB — POC COVID19 BINAXNOW: SARS Coronavirus 2 Ag: NEGATIVE

## 2022-06-18 LAB — POCT INFLUENZA A/B
Influenza A, POC: NEGATIVE
Influenza B, POC: NEGATIVE

## 2022-06-18 MED ORDER — OSELTAMIVIR PHOSPHATE 45 MG PO CAPS: 45.0000 mg | ORAL_CAPSULE | Freq: Two times a day (BID) | ORAL | 0 refills | Status: DC

## 2022-06-18 NOTE — Patient Instructions (Addendum)
Negative flu and covid Start tamiflu due to flu exposure and current symptoms. Encourage adequate oral hydration. Avoid decongestants if you have high blood pressure. Ok to use Coricidin HBP for sinus congestion Use" Delsym" or" Robitussin"  or Mucinex DM cough syrup varietis for cough.  You can use plain "Tylenol" or "Advi"l for fever, chills and achyness.   "Common cold" symptoms are usually triggered by a virus.  The antibiotics are usually not necessary. On average, a" viral cold" illness may take 7-10 days to resolve. Please, make an appointment if you are not better or if you're worse.

## 2022-06-18 NOTE — Progress Notes (Signed)
                Established Patient Visit  Patient: Meghan Mosley   DOB: 11-20-1985   36 y.o. Female  MRN: [id number] Visit Date: 06/18/2022  Subjective:    Chief Complaint  Patient presents with   Acute Visit    Son tested Pos for Flu on 12/2 C/o sore throat, stuffy nose, fever & chills x 3 days   URI  This is a new problem. The current episode started in the past 7 days. The problem has been unchanged. The maximum temperature recorded prior to her arrival was 100.4 - 100.9 F. The fever has been present for Less than 1 day. Associated symptoms include congestion, coughing, headaches, joint pain, rhinorrhea, sinus pain, sneezing and a sore throat. Pertinent negatives include no abdominal pain, chest pain, diarrhea, dysuria, ear pain, joint swelling, nausea, neck pain, plugged ear sensation, rash, swollen glands, vomiting or wheezing. She has tried acetaminophen and decongestant for the symptoms. The treatment provided mild relief.   Reviewed medical, surgical, and social history today  Medications: No outpatient medications prior to visit.   No facility-administered medications prior to visit.   Reviewed past medical and social history.   ROS per HPI above      Objective:  BP (!) 142/86 (BP Location: Right Arm, Patient Position: Sitting, Cuff Size: Normal)   Pulse 91   Temp 97.7 F (36.5 C) (Temporal)   Ht 5\' 3"  (1.6 m)   Wt 197 lb 12.8 oz (89.7 kg)   SpO2 98%   BMI 35.04 kg/m      Physical Exam Vitals reviewed.  Constitutional:      General: She is not in acute distress. Cardiovascular:     Rate and Rhythm: Normal rate and regular rhythm.     Pulses: Normal pulses.     Heart sounds: Normal heart sounds.  Pulmonary:     Effort: Pulmonary effort is normal.     Breath sounds: Normal breath sounds.  Neurological:     Mental Status: She is alert and oriented to person, place, and time.    No results found for any visits on 06/18/22.    Assessment & Plan:     Problem List Items Addressed This Visit   None Visit Diagnoses     Viral upper respiratory tract infection    -  Primary   Relevant Medications   oseltamivir (TAMIFLU) 45 MG capsule   Other Relevant Orders   POC COVID-19   POCT Influenza A/B   Exposure to influenza       Relevant Medications   oseltamivir (TAMIFLU) 45 MG capsule      Return if symptoms worsen or fail to improve.     14/04/23, NP

## 2022-06-22 ENCOUNTER — Telehealth: Payer: Self-pay | Admitting: Family Medicine

## 2022-06-22 DIAGNOSIS — J069 Acute upper respiratory infection, unspecified: Secondary | ICD-10-CM

## 2022-06-22 MED ORDER — PROMETHAZINE-DM 6.25-15 MG/5ML PO SYRP: 5.0000 mL | ORAL_SOLUTION | Freq: Four times a day (QID) | ORAL | 0 refills | Status: DC | PRN

## 2022-06-22 NOTE — Addendum Note (Signed)
Addended by: Alysia Penna L on: 06/22/2022 03:30 PM   Modules accepted: Orders

## 2022-06-22 NOTE — Addendum Note (Signed)
Addended by: Alysia Penna L on: 06/22/2022 05:50 PM   Modules accepted: Orders

## 2022-06-22 NOTE — Telephone Encounter (Signed)
Pt was seen by Claris Gower on 06/18/22 for flu like symptoms. She was told to cb if she started feeling worse. Her coughing has gotten worse, throwing up and diarrhea. Both of her sons have tested positive for RSV. Please advise pt at [phone]

## 2022-08-10 DIAGNOSIS — M9902 Segmental and somatic dysfunction of thoracic region: Secondary | ICD-10-CM | POA: Diagnosis not present

## 2022-08-10 DIAGNOSIS — M5417 Radiculopathy, lumbosacral region: Secondary | ICD-10-CM | POA: Diagnosis not present

## 2022-08-10 DIAGNOSIS — M9903 Segmental and somatic dysfunction of lumbar region: Secondary | ICD-10-CM | POA: Diagnosis not present

## 2022-08-10 DIAGNOSIS — M9904 Segmental and somatic dysfunction of sacral region: Secondary | ICD-10-CM | POA: Diagnosis not present

## 2022-08-14 DIAGNOSIS — Z01419 Encounter for gynecological examination (general) (routine) without abnormal findings: Secondary | ICD-10-CM | POA: Diagnosis not present

## 2022-08-14 DIAGNOSIS — Z6835 Body mass index (BMI) 35.0-35.9, adult: Secondary | ICD-10-CM | POA: Diagnosis not present

## 2022-08-16 DIAGNOSIS — M9903 Segmental and somatic dysfunction of lumbar region: Secondary | ICD-10-CM | POA: Diagnosis not present

## 2022-08-16 DIAGNOSIS — M9904 Segmental and somatic dysfunction of sacral region: Secondary | ICD-10-CM | POA: Diagnosis not present

## 2022-08-16 DIAGNOSIS — M5417 Radiculopathy, lumbosacral region: Secondary | ICD-10-CM | POA: Diagnosis not present

## 2022-08-16 DIAGNOSIS — M9902 Segmental and somatic dysfunction of thoracic region: Secondary | ICD-10-CM | POA: Diagnosis not present

## 2022-08-20 DIAGNOSIS — M5417 Radiculopathy, lumbosacral region: Secondary | ICD-10-CM | POA: Diagnosis not present

## 2022-08-20 DIAGNOSIS — M9904 Segmental and somatic dysfunction of sacral region: Secondary | ICD-10-CM | POA: Diagnosis not present

## 2022-08-20 DIAGNOSIS — M9902 Segmental and somatic dysfunction of thoracic region: Secondary | ICD-10-CM | POA: Diagnosis not present

## 2022-08-20 DIAGNOSIS — M9903 Segmental and somatic dysfunction of lumbar region: Secondary | ICD-10-CM | POA: Diagnosis not present

## 2022-08-24 DIAGNOSIS — M9904 Segmental and somatic dysfunction of sacral region: Secondary | ICD-10-CM | POA: Diagnosis not present

## 2022-08-24 DIAGNOSIS — M9903 Segmental and somatic dysfunction of lumbar region: Secondary | ICD-10-CM | POA: Diagnosis not present

## 2022-08-24 DIAGNOSIS — M9902 Segmental and somatic dysfunction of thoracic region: Secondary | ICD-10-CM | POA: Diagnosis not present

## 2022-08-24 DIAGNOSIS — M5417 Radiculopathy, lumbosacral region: Secondary | ICD-10-CM | POA: Diagnosis not present

## 2022-08-27 DIAGNOSIS — L723 Sebaceous cyst: Secondary | ICD-10-CM | POA: Diagnosis not present

## 2022-08-27 DIAGNOSIS — L720 Epidermal cyst: Secondary | ICD-10-CM | POA: Diagnosis not present

## 2022-08-30 DIAGNOSIS — M9902 Segmental and somatic dysfunction of thoracic region: Secondary | ICD-10-CM | POA: Diagnosis not present

## 2022-08-30 DIAGNOSIS — M9904 Segmental and somatic dysfunction of sacral region: Secondary | ICD-10-CM | POA: Diagnosis not present

## 2022-08-30 DIAGNOSIS — M5417 Radiculopathy, lumbosacral region: Secondary | ICD-10-CM | POA: Diagnosis not present

## 2022-08-30 DIAGNOSIS — M9903 Segmental and somatic dysfunction of lumbar region: Secondary | ICD-10-CM | POA: Diagnosis not present

## 2022-09-05 DIAGNOSIS — M5417 Radiculopathy, lumbosacral region: Secondary | ICD-10-CM | POA: Diagnosis not present

## 2022-09-05 DIAGNOSIS — M9902 Segmental and somatic dysfunction of thoracic region: Secondary | ICD-10-CM | POA: Diagnosis not present

## 2022-09-05 DIAGNOSIS — M9903 Segmental and somatic dysfunction of lumbar region: Secondary | ICD-10-CM | POA: Diagnosis not present

## 2022-09-05 DIAGNOSIS — M9904 Segmental and somatic dysfunction of sacral region: Secondary | ICD-10-CM | POA: Diagnosis not present

## 2022-09-10 DIAGNOSIS — M5417 Radiculopathy, lumbosacral region: Secondary | ICD-10-CM | POA: Diagnosis not present

## 2022-09-10 DIAGNOSIS — M9903 Segmental and somatic dysfunction of lumbar region: Secondary | ICD-10-CM | POA: Diagnosis not present

## 2022-09-10 DIAGNOSIS — M9902 Segmental and somatic dysfunction of thoracic region: Secondary | ICD-10-CM | POA: Diagnosis not present

## 2022-09-10 DIAGNOSIS — M9904 Segmental and somatic dysfunction of sacral region: Secondary | ICD-10-CM | POA: Diagnosis not present

## 2022-09-13 DIAGNOSIS — M5417 Radiculopathy, lumbosacral region: Secondary | ICD-10-CM | POA: Diagnosis not present

## 2022-09-13 DIAGNOSIS — M9904 Segmental and somatic dysfunction of sacral region: Secondary | ICD-10-CM | POA: Diagnosis not present

## 2022-09-13 DIAGNOSIS — M9903 Segmental and somatic dysfunction of lumbar region: Secondary | ICD-10-CM | POA: Diagnosis not present

## 2022-09-13 DIAGNOSIS — M9902 Segmental and somatic dysfunction of thoracic region: Secondary | ICD-10-CM | POA: Diagnosis not present

## 2022-10-02 DIAGNOSIS — G5601 Carpal tunnel syndrome, right upper limb: Secondary | ICD-10-CM | POA: Diagnosis not present

## 2022-10-02 DIAGNOSIS — G5602 Carpal tunnel syndrome, left upper limb: Secondary | ICD-10-CM | POA: Diagnosis not present

## 2022-10-02 DIAGNOSIS — G5603 Carpal tunnel syndrome, bilateral upper limbs: Secondary | ICD-10-CM | POA: Diagnosis not present

## 2022-10-24 DIAGNOSIS — G5602 Carpal tunnel syndrome, left upper limb: Secondary | ICD-10-CM | POA: Diagnosis not present

## 2022-11-06 DIAGNOSIS — G5602 Carpal tunnel syndrome, left upper limb: Secondary | ICD-10-CM | POA: Diagnosis not present

## 2022-12-18 DIAGNOSIS — M65352 Trigger finger, left little finger: Secondary | ICD-10-CM | POA: Diagnosis not present

## 2023-01-08 DIAGNOSIS — N911 Secondary amenorrhea: Secondary | ICD-10-CM | POA: Diagnosis not present

## 2023-01-14 DIAGNOSIS — Z3685 Encounter for antenatal screening for Streptococcus B: Secondary | ICD-10-CM | POA: Diagnosis not present

## 2023-01-14 DIAGNOSIS — Z3481 Encounter for supervision of other normal pregnancy, first trimester: Secondary | ICD-10-CM | POA: Diagnosis not present

## 2023-01-14 DIAGNOSIS — Z3A08 8 weeks gestation of pregnancy: Secondary | ICD-10-CM | POA: Diagnosis not present

## 2023-01-14 DIAGNOSIS — N898 Other specified noninflammatory disorders of vagina: Secondary | ICD-10-CM | POA: Diagnosis not present

## 2023-01-14 DIAGNOSIS — Z3A19 19 weeks gestation of pregnancy: Secondary | ICD-10-CM | POA: Diagnosis not present

## 2023-01-14 DIAGNOSIS — Z3A09 9 weeks gestation of pregnancy: Secondary | ICD-10-CM | POA: Diagnosis not present

## 2023-01-14 DIAGNOSIS — Z3482 Encounter for supervision of other normal pregnancy, second trimester: Secondary | ICD-10-CM | POA: Diagnosis not present

## 2023-01-14 DIAGNOSIS — Z113 Encounter for screening for infections with a predominantly sexual mode of transmission: Secondary | ICD-10-CM | POA: Diagnosis not present

## 2023-01-14 DIAGNOSIS — Z3400 Encounter for supervision of normal first pregnancy, unspecified trimester: Secondary | ICD-10-CM | POA: Diagnosis not present

## 2023-01-14 LAB — OB RESULTS CONSOLE HEPATITIS B SURFACE ANTIGEN: Hepatitis B Surface Ag: NEGATIVE

## 2023-01-14 LAB — OB RESULTS CONSOLE RUBELLA ANTIBODY, IGM: Rubella: NON-IMMUNE/NOT IMMUNE

## 2023-01-14 LAB — OB RESULTS CONSOLE GC/CHLAMYDIA
Chlamydia: NEGATIVE
Neisseria Gonorrhea: NEGATIVE

## 2023-01-14 LAB — HEPATITIS C ANTIBODY: HCV Ab: NEGATIVE

## 2023-01-14 LAB — CBC AND DIFFERENTIAL
HCT: 41 (ref 36–46)
Hemoglobin: 13.3 (ref 12.0–16.0)
Platelets: 320 10*3/uL (ref 150–400)

## 2023-01-14 LAB — OB RESULTS CONSOLE HIV ANTIBODY (ROUTINE TESTING): HIV: NONREACTIVE

## 2023-01-14 LAB — HM HIV SCREENING LAB: HM HIV Screening: NEGATIVE

## 2023-01-14 LAB — OB RESULTS CONSOLE RPR: RPR: NONREACTIVE

## 2023-01-22 DIAGNOSIS — Z3481 Encounter for supervision of other normal pregnancy, first trimester: Secondary | ICD-10-CM | POA: Diagnosis not present

## 2023-01-22 DIAGNOSIS — Z3A09 9 weeks gestation of pregnancy: Secondary | ICD-10-CM | POA: Diagnosis not present

## 2023-01-22 DIAGNOSIS — N898 Other specified noninflammatory disorders of vagina: Secondary | ICD-10-CM | POA: Diagnosis not present

## 2023-01-22 DIAGNOSIS — Z113 Encounter for screening for infections with a predominantly sexual mode of transmission: Secondary | ICD-10-CM | POA: Diagnosis not present

## 2023-01-22 DIAGNOSIS — Z3400 Encounter for supervision of normal first pregnancy, unspecified trimester: Secondary | ICD-10-CM | POA: Diagnosis not present

## 2023-03-05 DIAGNOSIS — G5601 Carpal tunnel syndrome, right upper limb: Secondary | ICD-10-CM | POA: Diagnosis not present

## 2023-03-06 DIAGNOSIS — R319 Hematuria, unspecified: Secondary | ICD-10-CM | POA: Diagnosis not present

## 2023-03-19 DIAGNOSIS — R300 Dysuria: Secondary | ICD-10-CM | POA: Diagnosis not present

## 2023-03-27 DIAGNOSIS — Z363 Encounter for antenatal screening for malformations: Secondary | ICD-10-CM | POA: Diagnosis not present

## 2023-03-27 DIAGNOSIS — Z3A19 19 weeks gestation of pregnancy: Secondary | ICD-10-CM | POA: Diagnosis not present

## 2023-06-19 ENCOUNTER — Ambulatory Visit: Payer: 59 | Admitting: Internal Medicine

## 2023-06-19 ENCOUNTER — Encounter: Payer: Self-pay | Admitting: Internal Medicine

## 2023-06-19 VITALS — BP 128/80 | HR 97 | Temp 98.3°F | Ht 63.0 in | Wt 213.4 lb

## 2023-06-19 DIAGNOSIS — J069 Acute upper respiratory infection, unspecified: Secondary | ICD-10-CM | POA: Diagnosis not present

## 2023-06-19 LAB — POC INFLUENZA A&B (BINAX/QUICKVUE)
Influenza A, POC: NEGATIVE
Influenza B, POC: NEGATIVE

## 2023-06-19 LAB — POCT RAPID STREP A (OFFICE): Rapid Strep A Screen: NEGATIVE

## 2023-06-19 LAB — POC COVID19 BINAXNOW: SARS Coronavirus 2 Ag: NEGATIVE

## 2023-06-19 NOTE — Patient Instructions (Signed)
Safe Medications in Pregnancy  ? ?Colds/Coughs/Allergies: ?Benadryl (alcohol free) 25 mg every 6 hours as needed ?Breath right strips ?Claritin ?Cepacol throat lozenges ?Chloraseptic throat spray ?Cold-Eeze- up to three times per day ?Cough drops, alcohol free ?Flonase (by prescription only) ?Guaifenesin ?Mucinex ?Robitussin DM (plain only, alcohol free) ?Saline nasal spray/drops ?Sudafed (pseudoephedrine) & Actifed ** use only after [redacted] weeks gestation and if you do not have high blood pressure ?Tylenol ?Vicks Vaporub ?Zinc lozenges ?Zyrtec  ? ? ? ?**If taking multiple medications, please check labels to avoid duplicating the same active ingredients ?**take medication as directed on the label ?** Do not exceed 4000 mg of tylenol in 24 hours ?**Do not take medications that contain aspirin or ibuprofen ? ?  ?

## 2023-06-19 NOTE — Progress Notes (Signed)
[facility] [address] [place] [address] Dept: [phone] Dept Fax: [phone]  Acute Care Office Visit  Subjective:   Meghan Mosley Apr 18, 1986 06/19/2023  Chief Complaint  Patient presents with   Sore Throat    Started 2 days ago     HPI: Discussed the use of AI scribe software for clinical note transcription with the patient, who gave verbal consent to proceed.  History of Present Illness   The patient, [redacted] weeks pregnant G2 P1 A0, presents with a 2-day history of worsening sore throat and nasal congestion. She describes the throat pain as severe, causing her to awaken last night, and a sensation of a 'lump' when swallowing. She has tried gargling with warm salt water and honey, which usually alleviates her sore throat, but these remedies have not been effective. She also reports exposure to strep throat at her son's daycare.  In addition to the sore throat, she has a runny nose and occasional gagging and coughing due to postnasal drip. She denies fever, body aches, chest pain, shortness of breath, and wheezing. She has tried Mucinex, which seemed to help with the sinus congestion but not the throat pain.       The following portions of the patient's history were reviewed and updated as appropriate: past medical history, past surgical history, family history, social history, allergies, medications, and problem list.   Patient Active Problem List   Diagnosis Date Noted   Neck mass 04/23/2022   Former smoker 04/23/2022   Anxiety 04/23/2022   Abdominal pain 01/10/2022   Pregnancy induced hypertension 06/23/2021   Pediatric pre-birth visit for expectant parent 04/06/2021   Past Medical History:  Diagnosis Date   Medical history non-contributory    Past Surgical History:  Procedure Laterality Date   MOUTH SURGERY  2008   wisdom teeth, have re grown   Family History  Problem Relation Age of Onset    Hypertension Mother    Heart disease Mother    Breast cancer Mother    Hypertension Maternal Grandfather    Heart attack Maternal Grandfather    Breast cancer Paternal Grandmother    Hypertension Paternal Grandfather    Diabetes Other    Fibroids Other     Current Outpatient Medications:    aspirin EC 81 MG tablet, Take 1 tablet every day by oral route., Disp: , Rfl:    Docosahexaenoic Acid (PRENATAL DHA PO), , Disp: , Rfl:  Allergies  Allergen Reactions   Gluten Meal Diarrhea and Nausea And Vomiting   Pineapple    Tramadol Diarrhea and Nausea And Vomiting     ROS: A complete ROS was performed with pertinent positives/negatives noted in the HPI. The remainder of the ROS are negative.    Objective:   Today's Vitals   06/19/23 1053  BP: 128/80  Pulse: 97  Temp: 98.3 F (36.8 C)  TempSrc: Temporal  SpO2: 99%  Weight: 213 lb 6.4 oz (96.8 kg)  Height: 5\' 3"  (1.6 m)    GENERAL: Well-appearing, in NAD. Well nourished.  SKIN: Pink, warm and dry. No rash, lesion, ulceration, or ecchymoses.  HEENT:    HEAD: Normocephalic, non-traumatic.  EYES: Conjunctive pink without exudate.  EARS: External ear w/o redness, swelling, masses, or lesions. EAC clear. TM's intact, translucent w/o bulging, appropriate landmarks visualized.  NOSE: Septum midline w/o deformity. Nares patent, mucosa pink and inflamed with drainage. Minimal maxillary sinus tenderness.  THROAT: Uvula midline. Oropharynx with moderate-large amount of white-yellow  PND. Tonsils non-inflamed w/o exudate. Mucus membranes pink and moist.  NECK: Trachea midline. Full ROM w/o pain or tenderness. Submandibular lymph node enlargement bilaterally RESPIRATORY: Chest wall symmetrical. Respirations even and non-labored. Breath sounds clear to auscultation bilaterally. (+)cough CARDIAC: S1, S2 present, regular rate and rhythm. Peripheral pulses 2+ bilaterally.  EXTREMITIES: Without clubbing, cyanosis, or edema.  NEUROLOGIC: Steady,  even gait.  PSYCH/MENTAL STATUS: Alert, oriented x 3. Cooperative, appropriate mood and affect.    Results for orders placed or performed in visit on 06/19/23  POCT rapid strep A  Result Value Ref Range   Rapid Strep A Screen Negative Negative  POC COVID-19  Result Value Ref Range   SARS Coronavirus 2 Ag Negative Negative  POC Influenza A&B (Binax test)  Result Value Ref Range   Influenza A, POC Negative Negative   Influenza B, POC Negative Negative      Assessment & Plan:  Assessment and Plan    Acute Upper Respiratory Infection [redacted] weeks pregnant with 2-day history of worsening sore throat, congestion, and postnasal drainage. No fever, cough, chest pain, shortness of breath, or wheezing. Tonsils not inflamed or exudative.  Negative for strep, COVID, and flu. -Continue supportive care with warm salt water gargles and honey. -Continue Mucinex as it seems to help with congestion. -Provided list of over-the-counter pregnancy-safe medications for cough/congestion. -Follow up if symptoms worsen or fail to improve.     Safe Medications in Pregnancy   Colds/Coughs/Allergies:  Benadryl (alcohol free) 25 mg every 6 hours as needed  Breath right strips  Claritin  Cepacol throat lozenges  Chloraseptic throat spray  Cold-Eeze- up to three times per day  Cough drops, alcohol free  Flonase (by prescription only)  Guaifenesin  Mucinex  Robitussin DM (plain only, alcohol free)  Saline nasal spray/drops  Sudafed (pseudoephedrine) & Actifed * use only after [redacted] weeks gestation and if you do not have high blood pressure  Tylenol  Vicks Vaporub  Zinc lozenges  Zyrtec    **If taking multiple medications, please check labels to avoid duplicating the same active ingredients  **take medication as directed on the label  ** Do not exceed 4000 mg of tylenol in 24 hours  **Do not take medications that contain aspirin or ibuprofen   No orders of the defined types were placed in this  encounter.  Orders Placed This Encounter  Procedures   POCT rapid strep A   POC COVID-19    Order Specific Question:   Previously tested for COVID-19    Answer:   No    Order Specific Question:   Resident in a congregate (group) care setting    Answer:   No    Order Specific Question:   Employed in healthcare setting    Answer:   Unknown    Order Specific Question:   Pregnant    Answer:   Yes   POC Influenza A&B (Binax test)   Lab Orders         POCT rapid strep A         POC COVID-19         POC Influenza A&B (Binax test)     No images are attached to the encounter or orders placed in the encounter.  Return if symptoms worsen or fail to improve.   Salvatore Decent, FNP

## 2023-07-17 NOTE — L&D Delivery Note (Signed)
Delivery Note At 6:32 AM a viable female was delivered via Vaginal, Spontaneous (Presentation: occiput anterior).  APGAR: 6, 9; weight pending.   Placenta status: Manual removal, Intact > to pathology.  Cord: 3 vessels with the following complications: None.   Anesthesia: Epidural Episiotomy: None Lacerations: None Est. Blood Loss (mL): 360ccs  Mom to postpartum.  Baby to Couplet care / Skin to Skin.  Tawni Levy 08/01/2023, 6:59 AM

## 2023-07-28 ENCOUNTER — Inpatient Hospital Stay (HOSPITAL_COMMUNITY)
Admission: AD | Admit: 2023-07-28 | Discharge: 2023-08-02 | DRG: 806 | Disposition: A | Discharge status: Home / Self Care | Payer: 59 | Attending: Obstetrics and Gynecology | Admitting: Obstetrics and Gynecology

## 2023-07-28 DIAGNOSIS — A0811 Acute gastroenteropathy due to Norwalk agent: Secondary | ICD-10-CM

## 2023-07-28 DIAGNOSIS — O163 Unspecified maternal hypertension, third trimester: Secondary | ICD-10-CM

## 2023-07-28 DIAGNOSIS — O134 Gestational [pregnancy-induced] hypertension without significant proteinuria, complicating childbirth: Principal | ICD-10-CM | POA: Diagnosis present

## 2023-07-28 DIAGNOSIS — Z7982 Long term (current) use of aspirin: Secondary | ICD-10-CM

## 2023-07-28 DIAGNOSIS — Z8249 Family history of ischemic heart disease and other diseases of the circulatory system: Secondary | ICD-10-CM

## 2023-07-28 DIAGNOSIS — A084 Viral intestinal infection, unspecified: Secondary | ICD-10-CM | POA: Diagnosis present

## 2023-07-28 DIAGNOSIS — I161 Hypertensive emergency: Secondary | ICD-10-CM | POA: Diagnosis present

## 2023-07-28 DIAGNOSIS — O368130 Decreased fetal movements, third trimester, not applicable or unspecified: Secondary | ICD-10-CM | POA: Diagnosis present

## 2023-07-28 DIAGNOSIS — O9852 Other viral diseases complicating childbirth: Secondary | ICD-10-CM | POA: Diagnosis present

## 2023-07-28 DIAGNOSIS — O139 Gestational [pregnancy-induced] hypertension without significant proteinuria, unspecified trimester: Secondary | ICD-10-CM | POA: Diagnosis present

## 2023-07-28 DIAGNOSIS — Z833 Family history of diabetes mellitus: Secondary | ICD-10-CM

## 2023-07-28 DIAGNOSIS — Z3A37 37 weeks gestation of pregnancy: Principal | ICD-10-CM

## 2023-07-28 HISTORY — DX: Essential (primary) hypertension: I10

## 2023-07-28 HISTORY — DX: Anxiety disorder, unspecified: F41.9

## 2023-07-29 ENCOUNTER — Encounter (HOSPITAL_COMMUNITY): Payer: Self-pay

## 2023-07-29 ENCOUNTER — Other Ambulatory Visit: Payer: Self-pay

## 2023-07-29 DIAGNOSIS — I161 Hypertensive emergency: Secondary | ICD-10-CM | POA: Diagnosis present

## 2023-07-29 DIAGNOSIS — Z7982 Long term (current) use of aspirin: Secondary | ICD-10-CM | POA: Diagnosis not present

## 2023-07-29 DIAGNOSIS — O368130 Decreased fetal movements, third trimester, not applicable or unspecified: Secondary | ICD-10-CM | POA: Diagnosis present

## 2023-07-29 DIAGNOSIS — O134 Gestational [pregnancy-induced] hypertension without significant proteinuria, complicating childbirth: Secondary | ICD-10-CM | POA: Diagnosis present

## 2023-07-29 DIAGNOSIS — O26893 Other specified pregnancy related conditions, third trimester: Secondary | ICD-10-CM | POA: Diagnosis present

## 2023-07-29 DIAGNOSIS — Z833 Family history of diabetes mellitus: Secondary | ICD-10-CM | POA: Diagnosis not present

## 2023-07-29 DIAGNOSIS — Z8249 Family history of ischemic heart disease and other diseases of the circulatory system: Secondary | ICD-10-CM | POA: Diagnosis not present

## 2023-07-29 DIAGNOSIS — O9852 Other viral diseases complicating childbirth: Secondary | ICD-10-CM | POA: Diagnosis present

## 2023-07-29 DIAGNOSIS — A084 Viral intestinal infection, unspecified: Secondary | ICD-10-CM | POA: Diagnosis present

## 2023-07-29 DIAGNOSIS — Z3A37 37 weeks gestation of pregnancy: Secondary | ICD-10-CM | POA: Diagnosis not present

## 2023-07-29 HISTORY — DX: Hypertensive emergency: I16.1

## 2023-07-29 LAB — CBC
HCT: 44.6 % (ref 36.0–46.0)
HCT: 39.2 % (ref 36.0–46.0)
Hemoglobin: 14.6 g/dL (ref 12.0–15.0)
Hemoglobin: 12.9 g/dL (ref 12.0–15.0)
MCH: 27.8 pg (ref 26.0–34.0)
MCH: 28 pg (ref 26.0–34.0)
MCHC: 32.7 g/dL (ref 30.0–36.0)
MCHC: 32.9 g/dL (ref 30.0–36.0)
MCV: 85 fL (ref 80.0–100.0)
MCV: 85 fL (ref 80.0–100.0)
Platelets: 214 10*3/uL (ref 150–400)
Platelets: 217 10*3/uL (ref 150–400)
RBC: 5.25 MIL/uL — ABNORMAL HIGH (ref 3.87–5.11)
RBC: 4.61 MIL/uL (ref 3.87–5.11)
RDW: 14.7 % (ref 11.5–15.5)
RDW: 14.8 % (ref 11.5–15.5)
WBC: 16.2 10*3/uL — ABNORMAL HIGH (ref 4.0–10.5)
WBC: 12.2 10*3/uL — ABNORMAL HIGH (ref 4.0–10.5)
nRBC: 0 % (ref 0.0–0.2)
nRBC: 0 % (ref 0.0–0.2)

## 2023-07-29 LAB — COMPREHENSIVE METABOLIC PANEL
ALT: 16 U/L (ref 0–44)
ALT: 15 U/L (ref 0–44)
AST: 23 U/L (ref 15–41)
AST: 24 U/L (ref 15–41)
Albumin: 3.4 g/dL — ABNORMAL LOW (ref 3.5–5.0)
Albumin: 3 g/dL — ABNORMAL LOW (ref 3.5–5.0)
Alkaline Phosphatase: 134 U/L — ABNORMAL HIGH (ref 38–126)
Alkaline Phosphatase: 116 U/L (ref 38–126)
Anion gap: 15 (ref 5–15)
Anion gap: 11 (ref 5–15)
BUN: 12 mg/dL (ref 6–20)
BUN: 12 mg/dL (ref 6–20)
CO2: 16 mmol/L — ABNORMAL LOW (ref 22–32)
CO2: 19 mmol/L — ABNORMAL LOW (ref 22–32)
Calcium: 9.4 mg/dL (ref 8.9–10.3)
Calcium: 8.6 mg/dL — ABNORMAL LOW (ref 8.9–10.3)
Chloride: 104 mmol/L (ref 98–111)
Chloride: 106 mmol/L (ref 98–111)
Creatinine, Ser: 0.79 mg/dL (ref 0.44–1.00)
Creatinine, Ser: 0.85 mg/dL (ref 0.44–1.00)
GFR, Estimated: >60 mL/min (ref >60)
GFR, Estimated: >60 mL/min (ref >60)
Glucose, Bld: 119 mg/dL — ABNORMAL HIGH (ref 70–99)
Glucose, Bld: 126 mg/dL — ABNORMAL HIGH (ref 70–99)
Potassium: 3.7 mmol/L (ref 3.5–5.1)
Potassium: 3.7 mmol/L (ref 3.5–5.1)
Sodium: 135 mmol/L (ref 135–145)
Sodium: 136 mmol/L (ref 135–145)
Total Bilirubin: 0.7 mg/dL (ref 0.0–1.2)
Total Bilirubin: 0.7 mg/dL (ref 0.0–1.2)
Total Protein: 7.4 g/dL (ref 6.5–8.1)
Total Protein: 6.5 g/dL (ref 6.5–8.1)

## 2023-07-29 LAB — TYPE AND SCREEN
ABO/RH(D): A POS
Antibody Screen: NEGATIVE

## 2023-07-29 LAB — URINALYSIS, ROUTINE W REFLEX MICROSCOPIC
Bilirubin Urine: NEGATIVE
Glucose, UA: NEGATIVE mg/dL
Hgb urine dipstick: NEGATIVE
Ketones, ur: 80 mg/dL — AB
Nitrite: NEGATIVE
Protein, ur: 100 mg/dL — AB
Specific Gravity, Urine: 1.03 (ref 1.005–1.030)
pH: 5 (ref 5.0–8.0)

## 2023-07-29 LAB — PROTEIN / CREATININE RATIO, URINE
Creatinine, Urine: 288 mg/dL
Protein Creatinine Ratio: 0.53 mg/mg{creat} — ABNORMAL HIGH (ref 0.00–0.15)
Total Protein, Urine: 154 mg/dL

## 2023-07-29 MED ORDER — DICYCLOMINE HCL 20 MG PO TABS
20.0000 mg | ORAL_TABLET | Freq: Once | ORAL | Status: DC
  Filled 2023-07-29: qty 1

## 2023-07-29 MED ORDER — ONDANSETRON HCL 4 MG/2ML IJ SOLN
4.0000 mg | Freq: Four times a day (QID) | INTRAMUSCULAR | Status: DC | PRN
  Administered 2023-07-29: 4 mg via INTRAVENOUS
  Filled 2023-07-29: qty 2

## 2023-07-29 MED ORDER — ACETAMINOPHEN 325 MG PO TABS
650.0000 mg | ORAL_TABLET | ORAL | Status: DC | PRN
  Administered 2023-07-29 – 2023-07-30 (×2): 650 mg via ORAL
  Filled 2023-07-29 (×2): qty 2

## 2023-07-29 MED ORDER — HYDRALAZINE HCL 20 MG/ML IJ SOLN: 10.0000 mg | INTRAMUSCULAR | Status: DC | PRN

## 2023-07-29 MED ORDER — PRENATAL MULTIVITAMIN CH
1.0000 | ORAL_TABLET | Freq: Every day | ORAL | Status: DC
  Administered 2023-07-29 – 2023-07-30 (×2): 1 via ORAL
  Filled 2023-07-29 (×2): qty 1

## 2023-07-29 MED ORDER — LABETALOL HCL 5 MG/ML IV SOLN
20.0000 mg | INTRAVENOUS | Status: DC | PRN
  Administered 2023-07-29: 20 mg via INTRAVENOUS
  Filled 2023-07-29: qty 4

## 2023-07-29 MED ORDER — LABETALOL HCL 5 MG/ML IV SOLN: 80.0000 mg | INTRAVENOUS | Status: DC | PRN

## 2023-07-29 MED ORDER — LACTATED RINGERS IV SOLN
INTRAVENOUS | Status: AC
  Administered 2023-07-29: 1000 mL via INTRAVENOUS

## 2023-07-29 MED ORDER — ZOLPIDEM TARTRATE 5 MG PO TABS: 5.0000 mg | ORAL_TABLET | Freq: Every evening | ORAL | Status: DC | PRN

## 2023-07-29 MED ORDER — CALCIUM CARBONATE ANTACID 500 MG PO CHEW
2.0000 | CHEWABLE_TABLET | ORAL | Status: DC | PRN
  Administered 2023-07-30: 400 mg via ORAL
  Filled 2023-07-29: qty 2

## 2023-07-29 MED ORDER — LABETALOL HCL 5 MG/ML IV SOLN: 40.0000 mg | INTRAVENOUS | Status: DC | PRN

## 2023-07-29 MED ORDER — ONDANSETRON 4 MG PO TBDP: 4.0000 mg | ORAL_TABLET | Freq: Once | ORAL | Status: DC

## 2023-07-29 MED ORDER — DOCUSATE SODIUM 100 MG PO CAPS: 100.0000 mg | ORAL_CAPSULE | Freq: Every day | ORAL | Status: DC

## 2023-07-29 MED ORDER — LABETALOL HCL 5 MG/ML IV SOLN: 20.0000 mg | INTRAVENOUS | Status: DC | PRN

## 2023-07-29 MED ORDER — SODIUM CHLORIDE 0.9 % IV SOLN
25.0000 mg | Freq: Once | INTRAVENOUS | Status: AC
  Administered 2023-07-29: 25 mg via INTRAVENOUS
  Filled 2023-07-29: qty 1

## 2023-07-29 MED ORDER — LACTATED RINGERS IV SOLN: 125.0000 mL/h | INTRAVENOUS | Status: AC

## 2023-07-29 MED ORDER — FAMOTIDINE IN NACL 20-0.9 MG/50ML-% IV SOLN
20.0000 mg | Freq: Two times a day (BID) | INTRAVENOUS | Status: DC
  Administered 2023-07-29 (×2): 20 mg via INTRAVENOUS
  Filled 2023-07-29 (×4): qty 50

## 2023-07-29 MED ORDER — METOCLOPRAMIDE HCL 5 MG/ML IJ SOLN
10.0000 mg | Freq: Once | INTRAMUSCULAR | Status: AC
  Administered 2023-07-29: 10 mg via INTRAVENOUS
  Filled 2023-07-29: qty 2

## 2023-07-29 MED ORDER — LACTATED RINGERS IV BOLUS
1000.0000 mL | Freq: Once | INTRAVENOUS | Status: AC
  Administered 2023-07-29: 1000 mL via INTRAVENOUS

## 2023-07-29 NOTE — H&P (Signed)
 Chief Complaint:  Nausea, Emesis, and Diarrhea  HPI  HPI: Meghan Mosley is a 38 y.o. G2P1001 at 78w1dwho presents to maternity admissions reporting emesis (non-bloody, non-bilious) and diarrhea since 1900. Generalized abdominal tightening. DFM. Son was sick with N/V yesterday.   She reports good fetal movement, denies LOF, vaginal bleeding, vaginal itching/burning, urinary symptoms, h/a, dizziness, constipation or fever/chills.  She denies headache, visual changes or RUQ abdominal pain.   Past Medical History: Past Medical History:  Diagnosis Date   Anxiety    Hypertension    Gestational    Past obstetric history: OB History  Gravida Para Term Preterm AB Living  2 1 1   1   SAB IAB Ectopic Multiple Live Births     0 1    # Outcome Date GA Lbr Len/2nd Weight Sex Type Anes PTL Lv  2 Current           1 Term 06/24/21 [redacted]w[redacted]d / 00:35 3515 g M Vag-Spont EPI  LIV    Past Surgical History: Past Surgical History:  Procedure Laterality Date   CARPAL TUNNEL RELEASE Left    MOUTH SURGERY  07/16/2006   wisdom teeth, have re grown    Family History: Family History  Problem Relation Age of Onset   Hypertension Mother    Heart disease Mother    Breast cancer Mother    Hypertension Maternal Grandfather    Heart attack Maternal Grandfather    Breast cancer Paternal Grandmother    Hypertension Paternal Grandfather    Diabetes Other    Fibroids Other     Social History: Social History   Tobacco Use   Smoking status: Never    Passive exposure: Never   Smokeless tobacco: Never  Vaping Use   Vaping status: Never Used  Substance Use Topics   Alcohol use: Not Currently    Comment: social   Drug use: No    Allergies:  Allergies  Allergen Reactions   Gluten Meal Diarrhea and Nausea And Vomiting   Pineapple    Tramadol  Diarrhea and Nausea And Vomiting    Meds:  Medications Prior to Admission  Medication Sig Dispense Refill Last Dose/Taking   aspirin EC 81 MG tablet Take  1 tablet every day by oral route.   07/28/2023   Docosahexaenoic Acid (PRENATAL DHA PO)    07/28/2023    I have reviewed patient's Past Medical Hx, Surgical Hx, Family Hx, Social Hx, medications and allergies.   ROS:  Review of Systems Other systems negative  Physical Exam  Patient Vitals for the past 24 hrs:  BP Temp Temp src Pulse Resp SpO2 Height Weight  07/29/23 0120 (!) 150/87 -- -- (!) 104 -- -- -- --  07/29/23 0109 (!) 151/82 99.2 F (37.3 C) Axillary (!) 115 -- 96 % -- --  07/29/23 0050 -- -- -- -- -- 96 % -- --  07/29/23 0048 (!) 163/102 -- -- (!) 124 -- -- -- --  07/29/23 0013 (!) 173/102 98.4 F (36.9 C) Oral (!) 122 19 97 % 5' 3 (1.6 m) 98 kg   Constitutional: Well-developed, well-nourished female in no acute distress.  Cardiovascular: normal rate and rhythm Respiratory: normal effort, clear to auscultation bilaterally GI: Abd soft, non-tender, gravid appropriate for gestational age.   No rebound or guarding. MS: Extremities nontender, no edema, normal ROM Neurologic: Alert and oriented x 4.  GU: Neg CVAT.    FHT:  Baseline 145 , moderate variability, accelerations present, no decelerations Contractions: Rare  Labs: Results for orders placed or performed during the hospital encounter of 07/28/23 (from the past 24 hours)  Protein / creatinine ratio, urine     Status: Abnormal   Collection Time: 07/29/23 12:17 AM  Result Value Ref Range   Creatinine, Urine 288 mg/dL   Total Protein, Urine 154 mg/dL   Protein Creatinine Ratio 0.53 (H) 0.00 - 0.15 mg/mg[Cre]  Urinalysis, Routine w reflex microscopic -Urine, Clean Catch     Status: Abnormal   Collection Time: 07/29/23 12:20 AM  Result Value Ref Range   Color, Urine AMBER (A) YELLOW   APPearance CLOUDY (A) CLEAR   Specific Gravity, Urine 1.030 1.005 - 1.030   pH 5.0 5.0 - 8.0   Glucose, UA NEGATIVE NEGATIVE mg/dL   Hgb urine dipstick NEGATIVE NEGATIVE   Bilirubin Urine NEGATIVE NEGATIVE   Ketones, ur 80 (A)  NEGATIVE mg/dL   Protein, ur 899 (A) NEGATIVE mg/dL   Nitrite NEGATIVE NEGATIVE   Leukocytes,Ua SMALL (A) NEGATIVE   RBC / HPF 0-5 0 - 5 RBC/hpf   WBC, UA 11-20 0 - 5 WBC/hpf   Bacteria, UA FEW (A) NONE SEEN   Squamous Epithelial / HPF 11-20 0 - 5 /HPF   Mucus PRESENT    Non Squamous Epithelial 0-5 (A) NONE SEEN  Comprehensive metabolic panel     Status: Abnormal   Collection Time: 07/29/23  1:22 AM  Result Value Ref Range   Sodium 135 135 - 145 mmol/L   Potassium 3.7 3.5 - 5.1 mmol/L   Chloride 104 98 - 111 mmol/L   CO2 16 (L) 22 - 32 mmol/L   Glucose, Bld 119 (H) 70 - 99 mg/dL   BUN 12 6 - 20 mg/dL   Creatinine, Ser 9.20 0.44 - 1.00 mg/dL   Calcium  9.4 8.9 - 10.3 mg/dL   Total Protein 7.4 6.5 - 8.1 g/dL   Albumin 3.4 (L) 3.5 - 5.0 g/dL   AST 23 15 - 41 U/L   ALT 16 0 - 44 U/L   Alkaline Phosphatase 134 (H) 38 - 126 U/L   Total Bilirubin 0.7 0.0 - 1.2 mg/dL   GFR, Estimated >39 >39 mL/min   Anion gap 15 5 - 15  CBC     Status: Abnormal   Collection Time: 07/29/23  1:22 AM  Result Value Ref Range   WBC 16.2 (H) 4.0 - 10.5 K/uL   RBC 5.25 (H) 3.87 - 5.11 MIL/uL   Hemoglobin 14.6 12.0 - 15.0 g/dL   HCT 55.3 63.9 - 53.9 %   MCV 85.0 80.0 - 100.0 fL   MCH 27.8 26.0 - 34.0 pg   MCHC 32.7 30.0 - 36.0 g/dL   RDW 85.2 88.4 - 84.4 %   Platelets 214 150 - 400 K/uL   nRBC 0.0 0.0 - 0.2 %      Imaging:  No results found.  MAU Course/MDM: I have reviewed the triage vital signs and the nursing notes.   Pertinent labs & imaging results that were available during my care of the patient were reviewed by me and considered in my medical decision making (see chart for details).      I have reviewed her medical records including past results, notes and treatments.   I have ordered labs and reviewed results.  NST reviewed  Treatments in MAU included 1L LR bolus, Zofran , Reglan , labetalol .    Assessment: 1. [redacted] weeks gestation of pregnancy   2. Hypertension affecting pregnancy in  third trimester  3. Norovirus   Norovirus highly suspected given x 13 emesis and x 3  diarrhea since 1900 and child ill with similar symptoms. Leukocytosis of 16.2.   Hypertension in severe range requiring IV antihypertensives at term with Hx of severe preeclampsia in prior pregnancy at 39 weeks. - CBC, CMP, Urine pro/cr pending  DFM since onset of N/V and diarrhea. Two prolonged decels to the 90s in MAU; otherwise EFM reassuring.   Discussed patient, labs, monitoring and treatment thus far with Dr. Mat who agrees with admission for symptomatic control and monitoring.   Plan: Admit to Antepartum for symptomatic and BP control  Mardy Shropshire, MD FMOB Fellow, Faculty practice [facility], Center for [facility]  07/29/2023 2:02 AM

## 2023-07-29 NOTE — MAU Note (Signed)
 To BR to urinate-vomiting small amount of gastric emesis. LR bolus continues

## 2023-07-29 NOTE — Progress Notes (Addendum)
 37 1/7  Tolerating water and ice chips. No vomiting in several hours since transfer to floor. Had diarrhea about 7:00 am, the first since about 11:00 pm last night. No HA, no SOB Voiding well-she reports dilute urine  Today's Vitals   07/29/23 0441 07/29/23 0500 07/29/23 0606 07/29/23 0641  BP:  135/72    Pulse:  (!) 120    Resp:  18    Temp:  98.2 F (36.8 C)    TempSrc:  Oral    SpO2: 95% 95%    Weight:      Height:      PainSc:   3  2    Body mass index is 38.28 kg/m.   Abdomen uterus NT  FHT cat one UCs irritability  Results for orders placed or performed during the hospital encounter of 07/28/23 (from the past 24 hours)  Protein / creatinine ratio, urine     Status: Abnormal   Collection Time: 07/29/23 12:17 AM  Result Value Ref Range   Creatinine, Urine 288 mg/dL   Total Protein, Urine 154 mg/dL   Protein Creatinine Ratio 0.53 (H) 0.00 - 0.15 mg/mg[Cre]  Urinalysis, Routine w reflex microscopic -Urine, Clean Catch     Status: Abnormal   Collection Time: 07/29/23 12:20 AM  Result Value Ref Range   Color, Urine AMBER (A) YELLOW   APPearance CLOUDY (A) CLEAR   Specific Gravity, Urine 1.030 1.005 - 1.030   pH 5.0 5.0 - 8.0   Glucose, UA NEGATIVE NEGATIVE mg/dL   Hgb urine dipstick NEGATIVE NEGATIVE   Bilirubin Urine NEGATIVE NEGATIVE   Ketones, ur 80 (A) NEGATIVE mg/dL   Protein, ur 899 (A) NEGATIVE mg/dL   Nitrite NEGATIVE NEGATIVE   Leukocytes,Ua SMALL (A) NEGATIVE   RBC / HPF 0-5 0 - 5 RBC/hpf   WBC, UA 11-20 0 - 5 WBC/hpf   Bacteria, UA FEW (A) NONE SEEN   Squamous Epithelial / HPF 11-20 0 - 5 /HPF   Mucus PRESENT    Non Squamous Epithelial 0-5 (A) NONE SEEN  Comprehensive metabolic panel     Status: Abnormal   Collection Time: 07/29/23  1:22 AM  Result Value Ref Range   Sodium 135 135 - 145 mmol/L   Potassium 3.7 3.5 - 5.1 mmol/L   Chloride 104 98 - 111 mmol/L   CO2 16 (L) 22 - 32 mmol/L   Glucose, Bld 119 (H) 70 - 99 mg/dL   BUN 12 6 - 20 mg/dL    Creatinine, Ser 9.20 0.44 - 1.00 mg/dL   Calcium  9.4 8.9 - 10.3 mg/dL   Total Protein 7.4 6.5 - 8.1 g/dL   Albumin 3.4 (L) 3.5 - 5.0 g/dL   AST 23 15 - 41 U/L   ALT 16 0 - 44 U/L   Alkaline Phosphatase 134 (H) 38 - 126 U/L   Total Bilirubin 0.7 0.0 - 1.2 mg/dL   GFR, Estimated >39 >39 mL/min   Anion gap 15 5 - 15  CBC     Status: Abnormal   Collection Time: 07/29/23  1:22 AM  Result Value Ref Range   WBC 16.2 (H) 4.0 - 10.5 K/uL   RBC 5.25 (H) 3.87 - 5.11 MIL/uL   Hemoglobin 14.6 12.0 - 15.0 g/dL   HCT 55.3 63.9 - 53.9 %   MCV 85.0 80.0 - 100.0 fL   MCH 27.8 26.0 - 34.0 pg   MCHC 32.7 30.0 - 36.0 g/dL   RDW 85.2 88.4 - 84.4 %  Platelets 214 150 - 400 K/uL   nRBC 0.0 0.0 - 0.2 %  Type and screen [facility]     Status: None   Collection Time: 07/29/23  3:04 AM  Result Value Ref Range   ABO/RH(D) A POS    Antibody Screen NEG    Sample Expiration      08/01/2023,2359 Performed at [facility], [address] [place], [place] [address]      A/P: 1) Probable Norovirus-continue supportive care-IV fluids, antiemetics             Sxs imroving             She wants to eat. Will start with bland diet and advance as tolerated          2) Hypertension-BPs improving into normal range, no HA             She had HTN last pregnancy and received magnesium  sulfate PP             PIH labs normal so far- repeat this afternoon            3) FWB-FHT cat one

## 2023-07-29 NOTE — MAU Note (Signed)
.  Meghan Mosley is a 38 y.o. at [redacted]w[redacted]d here in MAU reporting: vomiting and diarrhea since 1900 - emesis x10, unable to tolerate liquids, and diarrhea x3. Reports abdomen was tightening when she vomits, but now feels more constant. Reports son was sick yesterday with N/V. Denies VB or LOF. Reports less FM since this started - reports movement here in triage. Denies HA, visual changes, or epigastric pain.    Onset of complaint: 1900 Pain score: 4 Vitals:   07/29/23 0013  BP: (!) 173/102  Pulse: (!) 122  Resp: 19  Temp: 98.4 F (36.9 C)  SpO2: 97%     FHT:168 Lab orders placed from triage: ua

## 2023-07-30 MED ORDER — SODIUM CHLORIDE 0.9 % IV SOLN
5.0000 10*6.[IU] | Freq: Once | INTRAVENOUS | Status: AC
  Administered 2023-07-31: 5 10*6.[IU] via INTRAVENOUS
  Filled 2023-07-30: qty 5

## 2023-07-30 MED ORDER — SODIUM CHLORIDE 0.9 % IV SOLN
5.0000 10*6.[IU] | Freq: Once | INTRAVENOUS | Status: DC
  Filled 2023-07-30: qty 5

## 2023-07-30 MED ORDER — PENICILLIN G POT IN DEXTROSE 60000 UNIT/ML IV SOLN: 3.0000 10*6.[IU] | INTRAVENOUS | Status: DC

## 2023-07-30 NOTE — Progress Notes (Addendum)
 Patient has discussed IOL with her partner and would like to proceed.  RN was informed of inaccurate EDD in Epic and will update to reflect EDD 2/5.  L&D charge RN was was informed of plan for IOL for GHTN without severe features and will move her to L&D after MN when able to accommodate her induction.  Orders placed for cytotec  IOL.  Duwaine Blumenthal, DO

## 2023-07-30 NOTE — Progress Notes (Addendum)
-  Ante Note-  Patient reports feeling dramatically improved regarding nausea, vomiting and diarrhea.  She denies HA, vision change, RUQ pain, CP/SOB.  Active FM.     07/30/2023    8:07 AM 07/30/2023    3:23 AM 07/30/2023    3:06 AM  Vitals with BMI  Systolic 151 148 839  Diastolic 81 71 86  Pulse 92 101 101      Latest Ref Rng & Units 07/29/2023   11:12 AM 07/29/2023    1:22 AM 01/14/2023   12:00 AM  CBC  WBC 4.0 - 10.5 K/uL 12.2  16.2    Hemoglobin 12.0 - 15.0 g/dL 87.0  85.3  86.6      Hematocrit 36.0 - 46.0 % 39.2  44.6  41      Platelets 150 - 400 K/uL 217  214  320         This result is from an external source.      Latest Ref Rng & Units 07/29/2023   11:12 AM 07/29/2023    1:22 AM 01/10/2022   10:56 AM  CMP  Glucose 70 - 99 mg/dL 873  880  877   BUN 6 - 20 mg/dL 12  12  14    Creatinine 0.44 - 1.00 mg/dL 9.14  9.20  9.20   Sodium 135 - 145 mmol/L 136  135  139   Potassium 3.5 - 5.1 mmol/L 3.7  3.7  4.4   Chloride 98 - 111 mmol/L 106  104  102   CO2 22 - 32 mmol/L 19  16  27    Calcium  8.9 - 10.3 mg/dL 8.6  9.4  9.8   Total Protein 6.5 - 8.1 g/dL 6.5  7.4  7.9   Total Bilirubin 0.0 - 1.2 mg/dL 0.7  0.7  0.3   Alkaline Phos 38 - 126 U/L 116  134  82   AST 15 - 41 U/L 24  23  15    ALT 0 - 44 U/L 15  16  14     Gen: A&O X 3 Abd: soft, NT Ext: no c/c/e  FHT Cat I Toco flat  HD2 37 yo G2P1001 at [redacted]w[redacted]d admitted with viral gastroenteritis; newly dx GHTN -EDD is incorrectly entered into EPIC.  It is actually 2/5.   -Patient is improved regarding viral gastroenteritis -GHTN-I recommend IOL for patient given this new dx.  Recommend IOL starting at MN.  She would like to discuss this with her partner as he recently fell ill with N/V/D and is at home.  She is counseled re: steps of IOL.  She is counseled that given early term delivery, newborn is more likely to have respiratory distress and require NICU admission.  All questions were answered.  Will revisit patient in a few hours  to allow her to discuss with her partner.    Duwaine Blumenthal, DO

## 2023-07-31 ENCOUNTER — Inpatient Hospital Stay (HOSPITAL_COMMUNITY): Payer: 59 | Admitting: Anesthesiology

## 2023-07-31 ENCOUNTER — Inpatient Hospital Stay (HOSPITAL_COMMUNITY): Admission: AD | Admit: 2023-07-31 | Payer: 59 | Source: Home / Self Care | Admitting: Obstetrics and Gynecology

## 2023-07-31 ENCOUNTER — Inpatient Hospital Stay (HOSPITAL_COMMUNITY): Payer: 59 | Attending: Obstetrics and Gynecology

## 2023-07-31 DIAGNOSIS — O139 Gestational [pregnancy-induced] hypertension without significant proteinuria, unspecified trimester: Secondary | ICD-10-CM

## 2023-07-31 HISTORY — DX: Gestational (pregnancy-induced) hypertension without significant proteinuria, unspecified trimester: O13.9

## 2023-07-31 LAB — CBC
HCT: 33.8 % — ABNORMAL LOW (ref 36.0–46.0)
Hemoglobin: 11.1 g/dL — ABNORMAL LOW (ref 12.0–15.0)
MCH: 27.9 pg (ref 26.0–34.0)
MCHC: 32.8 g/dL (ref 30.0–36.0)
MCV: 84.9 fL (ref 80.0–100.0)
Platelets: 176 10*3/uL (ref 150–400)
RBC: 3.98 MIL/uL (ref 3.87–5.11)
RDW: 14.9 % (ref 11.5–15.5)
WBC: 7.9 10*3/uL (ref 4.0–10.5)
nRBC: 0 % (ref 0.0–0.2)

## 2023-07-31 LAB — COMPREHENSIVE METABOLIC PANEL
ALT: 14 U/L (ref 0–44)
AST: 22 U/L (ref 15–41)
Albumin: 2.4 g/dL — ABNORMAL LOW (ref 3.5–5.0)
Alkaline Phosphatase: 97 U/L (ref 38–126)
Anion gap: 7 (ref 5–15)
BUN: 7 mg/dL (ref 6–20)
CO2: 19 mmol/L — ABNORMAL LOW (ref 22–32)
Calcium: 8.3 mg/dL — ABNORMAL LOW (ref 8.9–10.3)
Chloride: 111 mmol/L (ref 98–111)
Creatinine, Ser: 0.54 mg/dL (ref 0.44–1.00)
GFR, Estimated: >60 mL/min (ref >60)
Glucose, Bld: 91 mg/dL (ref 70–99)
Potassium: 3.5 mmol/L (ref 3.5–5.1)
Sodium: 137 mmol/L (ref 135–145)
Total Bilirubin: 0.4 mg/dL (ref 0.0–1.2)
Total Protein: 5.3 g/dL — ABNORMAL LOW (ref 6.5–8.1)

## 2023-07-31 LAB — TYPE AND SCREEN
ABO/RH(D): A POS
Antibody Screen: NEGATIVE

## 2023-07-31 LAB — RPR: RPR Ser Ql: NONREACTIVE

## 2023-07-31 LAB — PLATELET COUNT: Platelets: 196 10*3/uL (ref 150–400)

## 2023-07-31 LAB — HIV ANTIBODY (ROUTINE TESTING W REFLEX): HIV Screen 4th Generation wRfx: NONREACTIVE

## 2023-07-31 MED ORDER — LACTATED RINGERS IV SOLN
500.0000 mL | INTRAVENOUS | Status: AC | PRN
  Administered 2023-07-31: 500 mL via INTRAVENOUS

## 2023-07-31 MED ORDER — OXYCODONE-ACETAMINOPHEN 5-325 MG PO TABS: 1.0000 | ORAL_TABLET | ORAL | Status: DC | PRN

## 2023-07-31 MED ORDER — LACTATED RINGERS IV SOLN: INTRAVENOUS | Status: AC

## 2023-07-31 MED ORDER — OXYTOCIN-SODIUM CHLORIDE 30-0.9 UT/500ML-% IV SOLN
2.5000 [IU]/h | INTRAVENOUS | Status: DC
  Administered 2023-08-01: 2.5 [IU]/h via INTRAVENOUS
  Filled 2023-07-31: qty 500

## 2023-07-31 MED ORDER — LACTATED RINGERS IV SOLN: 500.0000 mL | Freq: Once | INTRAVENOUS | Status: DC

## 2023-07-31 MED ORDER — MISOPROSTOL 25 MCG QUARTER TABLET
25.0000 ug | ORAL_TABLET | ORAL | Status: DC | PRN
Start: 2023-07-31 — End: 2023-08-01
  Administered 2023-07-31: 25 ug via VAGINAL
  Filled 2023-07-31: qty 1

## 2023-07-31 MED ORDER — FENTANYL CITRATE (PF) 100 MCG/2ML IJ SOLN
50.0000 ug | INTRAMUSCULAR | Status: DC | PRN
  Administered 2023-07-31: 50 ug via INTRAVENOUS
  Filled 2023-07-31: qty 2

## 2023-07-31 MED ORDER — PHENYLEPHRINE 80 MCG/ML (10ML) SYRINGE FOR IV PUSH (FOR BLOOD PRESSURE SUPPORT)
80.0000 ug | PREFILLED_SYRINGE | INTRAVENOUS | Status: DC | PRN
Start: 2023-07-31 — End: 2023-08-01

## 2023-07-31 MED ORDER — BUPIVACAINE HCL (PF) 0.25 % IJ SOLN
INTRAMUSCULAR | Status: DC | PRN
  Administered 2023-07-31 (×2): 5 mL via EPIDURAL

## 2023-07-31 MED ORDER — EPHEDRINE 5 MG/ML INJ
10.0000 mg | INTRAVENOUS | Status: DC | PRN
Start: 2023-07-31 — End: 2023-08-01

## 2023-07-31 MED ORDER — DIPHENHYDRAMINE HCL 50 MG/ML IJ SOLN: 12.5000 mg | INTRAMUSCULAR | Status: DC | PRN

## 2023-07-31 MED ORDER — ZOLPIDEM TARTRATE 5 MG PO TABS: 5.0000 mg | ORAL_TABLET | Freq: Every evening | ORAL | Status: DC | PRN

## 2023-07-31 MED ORDER — ONDANSETRON HCL 4 MG/2ML IJ SOLN
4.0000 mg | Freq: Four times a day (QID) | INTRAMUSCULAR | Status: DC | PRN
  Administered 2023-07-31: 4 mg via INTRAVENOUS
  Filled 2023-07-31: qty 2

## 2023-07-31 MED ORDER — LIDOCAINE HCL (PF) 1 % IJ SOLN: 30.0000 mL | INTRAMUSCULAR | Status: DC | PRN

## 2023-07-31 MED ORDER — TERBUTALINE SULFATE 1 MG/ML IJ SOLN: 0.2500 mg | Freq: Once | INTRAMUSCULAR | Status: DC | PRN

## 2023-07-31 MED ORDER — PHENYLEPHRINE 80 MCG/ML (10ML) SYRINGE FOR IV PUSH (FOR BLOOD PRESSURE SUPPORT): 80.0000 ug | PREFILLED_SYRINGE | INTRAVENOUS | Status: DC | PRN

## 2023-07-31 MED ORDER — FENTANYL-BUPIVACAINE-NACL 0.5-0.125-0.9 MG/250ML-% EP SOLN
12.0000 mL/h | EPIDURAL | Status: DC | PRN
  Administered 2023-07-31 – 2023-08-01 (×2): 12 mL/h via EPIDURAL
  Filled 2023-07-31 (×2): qty 250

## 2023-07-31 MED ORDER — OXYCODONE-ACETAMINOPHEN 5-325 MG PO TABS: 2.0000 | ORAL_TABLET | ORAL | Status: DC | PRN

## 2023-07-31 MED ORDER — OXYTOCIN BOLUS FROM INFUSION
333.0000 mL | Freq: Once | INTRAVENOUS | Status: AC
  Administered 2023-08-01: 333 mL via INTRAVENOUS

## 2023-07-31 MED ORDER — LIDOCAINE-EPINEPHRINE (PF) 2 %-1:200000 IJ SOLN
INTRAMUSCULAR | Status: DC | PRN
  Administered 2023-07-31: 5 mL via EPIDURAL

## 2023-07-31 MED ORDER — OXYTOCIN-SODIUM CHLORIDE 30-0.9 UT/500ML-% IV SOLN
1.0000 m[IU]/min | INTRAVENOUS | Status: DC
  Administered 2023-07-31: 2 m[IU]/min via INTRAVENOUS
  Administered 2023-08-01: 10 m[IU]/min via INTRAVENOUS
  Administered 2023-08-01: 2 m[IU]/min via INTRAVENOUS

## 2023-07-31 MED ORDER — EPHEDRINE 5 MG/ML INJ: 10.0000 mg | INTRAVENOUS | Status: DC | PRN

## 2023-07-31 MED ORDER — ACETAMINOPHEN 325 MG PO TABS
650.0000 mg | ORAL_TABLET | ORAL | Status: DC | PRN
Start: 2023-07-31 — End: 2023-08-01

## 2023-07-31 MED ORDER — SOD CITRATE-CITRIC ACID 500-334 MG/5ML PO SOLN: 30.0000 mL | ORAL | Status: DC | PRN

## 2023-07-31 MED ORDER — PENICILLIN G POT IN DEXTROSE 60000 UNIT/ML IV SOLN
3.0000 10*6.[IU] | INTRAVENOUS | Status: DC
  Administered 2023-07-31 – 2023-08-01 (×5): 3 10*6.[IU] via INTRAVENOUS
  Filled 2023-07-31 (×6): qty 50

## 2023-07-31 NOTE — Anesthesia Preprocedure Evaluation (Signed)
 Anesthesia Evaluation  Patient identified by MRN, date of birth, ID band Patient awake    Reviewed: Allergy & Precautions, NPO status , Patient's Chart, lab work & pertinent test results, reviewed documented beta blocker date and time   History of Anesthesia Complications Negative for: history of anesthetic complications  Airway Mallampati: III  TM Distance: >3 FB     Dental no notable dental hx.    Pulmonary neg shortness of breath, neg COPD, neg PE   breath sounds clear to auscultation       Cardiovascular hypertension, (-) CAD, (-) Past MI, (-) Cardiac Stents and (-) CABG  Rhythm:Regular Rate:Normal     Neuro/Psych neg Seizures  Anxiety        GI/Hepatic ,neg GERD  ,,(+) neg Cirrhosis      Recent gastroenteritis   Endo/Other    Renal/GU Renal disease     Musculoskeletal   Abdominal   Peds  Hematology   Anesthesia Other Findings   Reproductive/Obstetrics (+) Pregnancy                              Anesthesia Physical Anesthesia Plan  ASA: 2  Anesthesia Plan: Epidural   Post-op Pain Management:    Induction:   PONV Risk Score and Plan: 2 and Ondansetron   Airway Management Planned:   Additional Equipment:   Intra-op Plan:   Post-operative Plan:   Informed Consent: I have reviewed the patients History and Physical, chart, labs and discussed the procedure including the risks, benefits and alternatives for the proposed anesthesia with the patient or authorized representative who has indicated his/her understanding and acceptance.       Plan Discussed with:   Anesthesia Plan Comments:          Anesthesia Quick Evaluation

## 2023-07-31 NOTE — Anesthesia Procedure Notes (Signed)
 Epidural Patient location during procedure: OB Start time: 07/31/2023 1:42 PM End time: 07/31/2023 1:55 PM  Staffing Anesthesiologist: Leslye Rast, MD Performed: anesthesiologist   Preanesthetic Checklist Completed: patient identified, IV checked, site marked, risks and benefits discussed, surgical consent, monitors and equipment checked, pre-op evaluation and timeout performed  Epidural Patient position: sitting Prep: DuraPrep Patient monitoring: heart rate, continuous pulse ox and blood pressure Approach: midline Location: L4-L5 Injection technique: LOR saline  Needle:  Needle type: Tuohy  Needle gauge: 17 G Needle length: 9 cm Needle insertion depth: 7 cm Catheter type: closed end flexible Catheter size: 19 Gauge Catheter at skin depth: 12 cm Test dose: negative and 1.5% lidocaine  with Epi 1:200 K  Assessment Events: blood not aspirated, no cerebrospinal fluid, injection not painful, no injection resistance, no paresthesia and negative IV test  Additional Notes Reason for block:procedure for pain

## 2023-07-31 NOTE — Plan of Care (Signed)
  Problem: Education: Goal: Knowledge of disease or condition will improve Outcome: Progressing Goal: Knowledge of the prescribed therapeutic regimen will improve Outcome: Progressing   Problem: Fluid Volume: Goal: Peripheral tissue perfusion will improve Outcome: Progressing   Problem: Clinical Measurements: Goal: Complications related to disease process, condition or treatment will be avoided or minimized Outcome: Progressing   Problem: Clinical Measurements: Goal: Complications related to the disease process, condition or treatment will be avoided or minimized Outcome: Progressing   Problem: Education: Goal: Knowledge of General Education information will improve Description: Including pain rating scale, medication(s)/side effects and non-pharmacologic comfort measures Outcome: Progressing   Problem: Health Behavior/Discharge Planning: Goal: Ability to manage health-related needs will improve Outcome: Progressing   Problem: Clinical Measurements: Goal: Ability to maintain clinical measurements within normal limits will improve Outcome: Progressing Goal: Will remain free from infection Outcome: Progressing Goal: Diagnostic test results will improve Outcome: Progressing Goal: Respiratory complications will improve Outcome: Progressing Goal: Cardiovascular complication will be avoided Outcome: Progressing   Problem: Activity: Goal: Risk for activity intolerance will decrease Outcome: Progressing   Problem: Nutrition: Goal: Adequate nutrition will be maintained Outcome: Progressing   Problem: Coping: Goal: Level of anxiety will decrease Outcome: Progressing   Problem: Elimination: Goal: Will not experience complications related to bowel motility Outcome: Progressing Goal: Will not experience complications related to urinary retention Outcome: Progressing   Problem: Pain Management: Goal: General experience of comfort will improve Outcome: Progressing    Problem: Safety: Goal: Ability to remain free from injury will improve Outcome: Progressing   Problem: Skin Integrity: Goal: Risk for impaired skin integrity will decrease Outcome: Progressing   Problem: Education: Goal: Knowledge of Childbirth will improve Outcome: Progressing Goal: Ability to make informed decisions regarding treatment and plan of care will improve Outcome: Progressing Goal: Ability to state and carry out methods to decrease the pain will improve Outcome: Progressing Goal: Individualized Educational Video(s) Outcome: Progressing   Problem: Coping: Goal: Ability to verbalize concerns and feelings about labor and delivery will improve Outcome: Progressing   Problem: Life Cycle: Goal: Ability to make normal progression through stages of labor will improve Outcome: Progressing Goal: Ability to effectively push during vaginal delivery will improve Outcome: Progressing   Problem: Role Relationship: Goal: Will demonstrate positive interactions with the child Outcome: Progressing   Problem: Safety: Goal: Risk of complications during labor and delivery will decrease Outcome: Progressing   Problem: Pain Management: Goal: Relief or control of pain from uterine contractions will improve Outcome: Progressing

## 2023-07-31 NOTE — Progress Notes (Signed)
 Labor Progress Note  S/p dose of cytotec  at 0430 - feeling mild cramping. FHT cat 1. Continue cytotec  IOL. We reviewed AROM/pitocin  when able.  Cathryn Cobb, MD

## 2023-07-31 NOTE — Progress Notes (Signed)
 Labor Progress Note  FHT remains cat 1 Cervix unchanged IUPC placed, will continue to uptitrate pit as needed for adequacy.  Cathryn Cobb, MD

## 2023-07-31 NOTE — Progress Notes (Signed)
 Labor Progress Note  Feeling stronger contractions. FB out with gentle traction. Cervix 4/70/-2. AROM was reviewed with her - performed, clear fluid with bloody show.  FHT remains cat 1.  Continue pitocin  augmentation. For epidural after CBC results. Bps remain mild range, no preE sxs.  Cathryn Cobb, MD

## 2023-07-31 NOTE — Progress Notes (Signed)
 Labor Progress Note  Cervix 1/th/hi FB 60ccs placed. Will start pitocin  up to 4 with FB in place.  Cathryn Cobb, MD

## 2023-08-01 ENCOUNTER — Encounter (HOSPITAL_COMMUNITY): Payer: Self-pay | Admitting: Obstetrics & Gynecology

## 2023-08-01 MED ORDER — ONDANSETRON HCL 4 MG/2ML IJ SOLN: 4.0000 mg | INTRAMUSCULAR | Status: DC | PRN

## 2023-08-01 MED ORDER — PRENATAL MULTIVITAMIN CH
1.0000 | ORAL_TABLET | Freq: Every day | ORAL | Status: DC
  Administered 2023-08-01 – 2023-08-02 (×2): 1 via ORAL
  Filled 2023-08-01 (×2): qty 1

## 2023-08-01 MED ORDER — SENNOSIDES-DOCUSATE SODIUM 8.6-50 MG PO TABS
2.0000 | ORAL_TABLET | Freq: Every day | ORAL | Status: DC
  Administered 2023-08-02: 2 via ORAL
  Filled 2023-08-01: qty 2

## 2023-08-01 MED ORDER — WITCH HAZEL-GLYCERIN EX PADS
1.0000 | MEDICATED_PAD | CUTANEOUS | Status: DC | PRN
  Administered 2023-08-01: 1 via TOPICAL

## 2023-08-01 MED ORDER — IBUPROFEN 600 MG PO TABS
600.0000 mg | ORAL_TABLET | Freq: Four times a day (QID) | ORAL | Status: DC
  Administered 2023-08-01 – 2023-08-02 (×5): 600 mg via ORAL
  Filled 2023-08-01 (×5): qty 1

## 2023-08-01 MED ORDER — BENZOCAINE-MENTHOL 20-0.5 % EX AERO
1.0000 | INHALATION_SPRAY | CUTANEOUS | Status: DC | PRN
Start: 2023-08-01 — End: 2023-08-02
  Administered 2023-08-01: 1 via TOPICAL
  Filled 2023-08-01: qty 56

## 2023-08-01 MED ORDER — SIMETHICONE 80 MG PO CHEW: 80.0000 mg | CHEWABLE_TABLET | ORAL | Status: DC | PRN

## 2023-08-01 MED ORDER — TETANUS-DIPHTH-ACELL PERTUSSIS 5-2.5-18.5 LF-MCG/0.5 IM SUSY: 0.5000 mL | PREFILLED_SYRINGE | Freq: Once | INTRAMUSCULAR | Status: DC

## 2023-08-01 MED ORDER — LACTATED RINGERS AMNIOINFUSION: INTRAVENOUS | Status: DC

## 2023-08-01 MED ORDER — ACETAMINOPHEN 325 MG PO TABS
650.0000 mg | ORAL_TABLET | ORAL | Status: DC | PRN
  Administered 2023-08-01 (×2): 650 mg via ORAL
  Filled 2023-08-01 (×2): qty 2

## 2023-08-01 MED ORDER — DIPHENHYDRAMINE HCL 25 MG PO CAPS
25.0000 mg | ORAL_CAPSULE | Freq: Four times a day (QID) | ORAL | Status: DC | PRN
Start: 2023-08-01 — End: 2023-08-02

## 2023-08-01 MED ORDER — DIBUCAINE (PERIANAL) 1 % EX OINT
1.0000 | TOPICAL_OINTMENT | CUTANEOUS | Status: DC | PRN
  Administered 2023-08-01: 1 via RECTAL
  Filled 2023-08-01: qty 28

## 2023-08-01 MED ORDER — ONDANSETRON HCL 4 MG PO TABS: 4.0000 mg | ORAL_TABLET | ORAL | Status: DC | PRN

## 2023-08-01 MED ORDER — ZOLPIDEM TARTRATE 5 MG PO TABS: 5.0000 mg | ORAL_TABLET | Freq: Every evening | ORAL | Status: DC | PRN

## 2023-08-01 MED ORDER — OXYCODONE HCL 5 MG PO TABS: 5.0000 mg | ORAL_TABLET | ORAL | Status: DC | PRN

## 2023-08-01 MED ORDER — COCONUT OIL OIL
1.0000 | TOPICAL_OIL | Status: DC | PRN
Start: 2023-08-01 — End: 2023-08-02

## 2023-08-01 NOTE — Lactation Note (Signed)
This note was copied from a baby's chart. Lactation Consultation Note  Patient Name: Girl Zenaya Growney [id number] Date: 08/01/2023 Age:36 hours Reason for consult: Initial assessment;Mother's request;Early term 37-38.6wks;Breastfeeding assistance  P2- MOB reports that infant latched well after birth, but has not been able to latch well since. MOB complains that infant is extremely sleepy and gaggy. LC reviewed the first 24 hr birthday nap, how infant is early which can lead to extra sleepiness and how gaggy unfortunately is normal sometimes when they have extra amniotic fluid left. LC offered to assist with a latch and MOB consented. MOB placed infant on the left breast in the cross cradle position. LC and MOB stimulated infant while attempting to latch for 10 minutes with no success. Infant seemed completely unbothered by the stimulation.   MOB reports that she has been leaking for over a month and has been able to hand express 2 + mL when infant does not latch. LC encouraged MOB to hand express and offer EBM when infant absolutely refuses the breast so she can have some milk. MOB in agreement. MOB has already been set up with a manual pump and has her Medela hands free pump. LC sized MOB at an 18 mm flange for the right breast and a 18 or 21 mm flange for the left breast. LC reviewed feeding infant on cue 8-12x in 24 hrs, not allowing infant tog o over 3 hrs without a feeding, CDC milk storage guidelines and LC services handout. LC encouraged MOB to call for further assistance as needed.  Maternal Data Has patient been taught Hand Expression?: Yes Does the patient have breastfeeding experience prior to this delivery?: Yes How long did the patient breastfeed?: 6 weeks  Feeding Mother's Current Feeding Choice: Breast Milk and Formula  LATCH Score Latch: Too sleepy or reluctant, no latch achieved, no sucking elicited.  Audible Swallowing: None  Type of Nipple: Everted at rest and after  stimulation  Comfort (Breast/Nipple): Soft / non-tender  Hold (Positioning): No assistance needed to correctly position infant at breast.  LATCH Score: 6   Lactation Tools Discussed/Used Tools: Pump;Flanges Flange Size: 18 Breast pump type: Manual Pump Education: Setup, frequency, and cleaning;Milk Storage Reason for Pumping: MOB request Pumping frequency: as needed  Interventions Interventions: Breast feeding basics reviewed;Assisted with latch;Breast compression;Adjust position;Support pillows;Position options;Education;Hand pump;LC Services brochure  Discharge Discharge Education: Warning signs for feeding baby Pump: Hands Free;Personal  Consult Status Consult Status: Follow-up Date: 08/02/23 Follow-up type: In-patient    Dema Severin BS, IBCLC 08/01/2023, 6:34 PM

## 2023-08-01 NOTE — Progress Notes (Signed)
Labor Progress Note  Notified by RN re deep variable decelerations around midnight - cervix at the time 7cm. We had discussed halving the pitocin and starting an amnioinfusion. Pitocin was turned off and AI was initiated with improvement in FHT. However, MVUs no longer adequate.   Cervix now 7/80/-1, on pit of 4 currently. Will continue to uptitrate as tolerated. We reviewed active labor course. Hopeful for continued progression but they understand indications for cesarean section.  Jule Economy, MD

## 2023-08-02 LAB — CBC
HCT: 33.5 % — ABNORMAL LOW (ref 36.0–46.0)
Hemoglobin: 11.2 g/dL — ABNORMAL LOW (ref 12.0–15.0)
MCH: 28.1 pg (ref 26.0–34.0)
MCHC: 33.4 g/dL (ref 30.0–36.0)
MCV: 84.2 fL (ref 80.0–100.0)
Platelets: 211 10*3/uL (ref 150–400)
RBC: 3.98 MIL/uL (ref 3.87–5.11)
RDW: 14.6 % (ref 11.5–15.5)
WBC: 9.7 10*3/uL (ref 4.0–10.5)
nRBC: 0 % (ref 0.0–0.2)

## 2023-08-02 NOTE — Anesthesia Postprocedure Evaluation (Signed)
Anesthesia Post Note  Patient: Meghan Mosley  Procedure(s) Performed: AN AD HOC LABOR EPIDURAL     Patient location during evaluation: Mother Baby Anesthesia Type: Epidural Level of consciousness: awake and alert Pain management: pain level controlled Vital Signs Assessment: post-procedure vital signs reviewed and stable Respiratory status: spontaneous breathing, nonlabored ventilation and respiratory function stable Cardiovascular status: stable Postop Assessment: no headache, no backache, epidural receding, no apparent nausea or vomiting, patient able to bend at knees, able to ambulate and adequate PO intake Anesthetic complications: no   No notable events documented.  Last Vitals:  Vitals:   08/01/23 2041 08/02/23 0635  BP: 124/77 133/87  Pulse: 68 61  Resp: 18 18  Temp: 36.6 C 36.7 C  SpO2: 100% 99%    Last Pain:  Vitals:   08/02/23 0635  TempSrc: Oral  PainSc: 0-No pain   Pain Goal:                   [place]

## 2023-08-02 NOTE — Lactation Note (Signed)
This note was copied from a baby's chart. Lactation Consultation Note  Patient Name: Meghan Mosley [id number] Date: 08/02/2023 Age:36 hours Reason for consult: Follow-up assessment;Early term 37-38.6wks;Infant weight loss (2 % weight loss,) now less than 6 pounds.  LC recommended since the baby's weight is now less than 6 pounds, to continue to offer the breast, and supplement afterwards and work up towards 30 ml per feeding or greater. Post pump both breast for 15 -20 mins and save the milk for the next feeding. ( Mom aware of the storage guidelines.)  LC reviewed BF D/C teaching and the [facility] resources.  LC offered to request and LC O/P and mom receptive.    Maternal Data    Feeding Mother's Current Feeding Choice: Breast Milk and Formula Nipple Type: Slow - flow  LATCH Score - per mom the baby latched for a few minutes in the middle of the night and then since the mik isn't in didn't stay long.     Lactation Tools Discussed/Used Tools: Pump;Flanges Flange Size: 18;21 Breast pump type: Manual Pump Education: Milk Storage;Setup, frequency, and cleaning  Interventions Interventions: Breast feeding basics reviewed;Hand pump;Education;CDC Guidelines for Breast Pump Cleaning  Discharge Discharge Education: Engorgement and breast care;Warning signs for feeding baby;Outpatient recommendation;Outpatient Epic message sent;Other (comment) (mom receptive  to return for [facility] O/P and aware the [facility] O/P will call her) Pump: Personal;Hands Free;Manual WIC Program: No  Consult Status Consult Status: Complete Date: 08/02/23    Kathrin Greathouse 08/02/2023, 11:21 AM

## 2023-08-02 NOTE — Social Work (Signed)
MOB was referred for history of depression/anxiety.  * Referral screened out by Clinical Social Worker because none of the following criteria appear to apply:  ~ History of anxiety/depression during this pregnancy, or of post-partum depression following prior delivery. No noted MH concerns during this pregnancy Edinburgh =0  ~ Diagnosis of anxiety and/or depression within last 3 years OR * MOB's symptoms currently being treated with medication and/or therapy. Per OB records MOB has been able to manage anxiety through therapy.   Please contact the Clinical Social Worker if needs arise, or by MOB request.   Wende Neighbors, LCSWA Clinical Social Worker [phone]

## 2023-08-02 NOTE — Discharge Summary (Signed)
Postpartum Discharge Summary      Patient Name: Meghan Mosley DOB: [birth year] MRN: [id number]  Date of admission: 07/28/2023 Delivery date:08/01/2023 Delivering provider: Tawni Levy Date of discharge: 08/02/2023  Admitting diagnosis: Hypertensive emergency [I16.1] Gestational hypertension [O13.9] Intrauterine pregnancy: [redacted]w[redacted]d     Secondary diagnosis:  Principal Problem:   Hypertensive emergency Active Problems:   Gestational hypertension  Additional problems: s/p noro virus recovered    Discharge diagnosis: Term Pregnancy Delivered , Gestational HTN                                             Post partum procedures:   Augmentation: AROM, Pitocin, and Cytotec Complications: None  Hospital course: Induction of Labor With Vaginal Delivery   38 y.o. yo G2P2002 at [redacted]w[redacted]d was admitted to the hospital 07/28/2023 for induction of labor.  Indication for induction: Gestational hypertension.  Patient had an labor course uncomplicated  Membrane Rupture Time/Date: 12:47 PM,08/01/2023  Delivery Method:Vaginal, Spontaneous Operative Delivery:N/A Episiotomy: None Lacerations:  None Details of delivery can be found in separate delivery note.  Patient had a postpartum course uncomplicated. Patient is discharged home 08/02/23.  Newborn Data: Birth date:08/01/2023 Birth time:6:32 AM Gender:Female Living status:Living Apgars:6 ,9  Weight:2760 g  Magnesium Sulfate received: No BMZ received: No Rhophylac:N/A MMR:N/A T-DaP:Given prenatally Flu: N/A RSV Vaccine received: No Transfusion:No Immunizations administered: Immunization History  Administered Date(s) Administered   Influenza Inj Mdck Quad Pf 04/28/2021   Influenza,inj,Quad PF,6+ Mos 03/24/2019, 04/23/2022   Influenza-Unspecified 04/20/2021   PFIZER(Purple Top)SARS-COV-2 Vaccination 10/16/2019, 11/10/2019   Tdap 03/31/2021    Physical exam  Vitals:   08/01/23 1353 08/01/23 1728 08/01/23 2041 08/02/23 0635  BP: 131/76  135/81 124/77 133/87  Pulse: 72 82 68 61  Resp: 16 16 18 18   Temp: 98.2 F (36.8 C) 98.2 F (36.8 C) 97.8 F (36.6 C) 98 F (36.7 C)  TempSrc: Oral Oral Oral Oral  SpO2: 97% 100% 100% 99%  Weight:      Height:       General: alert, cooperative, and no distress Lochia: appropriate Uterine Fundus: firm Incision: N/A DVT Evaluation: No evidence of DVT seen on physical exam. Labs: Lab Results  Component Value Date   WBC 9.7 08/02/2023   HGB 11.2 (L) 08/02/2023   HCT 33.5 (L) 08/02/2023   MCV 84.2 08/02/2023   PLT 211 08/02/2023      Latest Ref Rng & Units 07/31/2023    5:01 AM  CMP  Glucose 70 - 99 mg/dL 91   BUN 6 - 20 mg/dL 7   Creatinine 1.47 - 8.29 mg/dL 5.62   Sodium 130 - 865 mmol/L 137   Potassium 3.5 - 5.1 mmol/L 3.5   Chloride 98 - 111 mmol/L 111   CO2 22 - 32 mmol/L 19   Calcium 8.9 - 10.3 mg/dL 8.3   Total Protein 6.5 - 8.1 g/dL 5.3   Total Bilirubin 0.0 - 1.2 mg/dL 0.4   Alkaline Phos 38 - 126 U/L 97   AST 15 - 41 U/L 22   ALT 0 - 44 U/L 14    Edinburgh Score:    08/01/2023   11:00 PM  Edinburgh Postnatal Depression Scale Screening Tool  I have been able to laugh and see the funny side of things. 0  I have looked forward with enjoyment to things. 0  I  have blamed myself unnecessarily when things went wrong. 0  I have been anxious or worried for no good reason. 0  I have felt scared or panicky for no good reason. 0  Things have been getting on top of me. 0  I have been so unhappy that I have had difficulty sleeping. 0  I have felt sad or miserable. 0  I have been so unhappy that I have been crying. 0  The thought of harming myself has occurred to me. 0  Edinburgh Postnatal Depression Scale Total 0      After visit meds:  Allergies as of 08/02/2023       Reactions   Pineapple    Tramadol Diarrhea, Nausea And Vomiting        Medication List     STOP taking these medications    aspirin EC 81 MG tablet       TAKE these medications     PRENATAL DHA PO         Discharge home in stable condition Infant Feeding: Breast Infant Disposition:home with mother Discharge instruction: per After Visit Summary and Postpartum booklet. Activity: Advance as tolerated. Pelvic rest for 6 weeks.  Diet: routine diet Anticipated Birth Control: Unsure Postpartum Appointment:6 weeks Additional Postpartum F/U:    Future Appointments:No future appointments. Follow up Visit:      08/02/2023 Turner Daniels, MD

## 2023-08-05 LAB — SURGICAL PATHOLOGY

## 2023-08-13 ENCOUNTER — Telehealth (HOSPITAL_COMMUNITY): Payer: Self-pay

## 2023-08-13 NOTE — Telephone Encounter (Signed)
08/13/2023 1822  Name: Meghan Mosley MRN: [id number] DOB: [birth year]  Reason for Call:  Transition of Care Hospital Discharge Call  Contact Status: Patient Contact Status: Message  Language assistant needed:          Follow-Up Questions:    Inocente Salles Postnatal Depression Scale:  In the Past 7 Days:    PHQ2-9 Depression Scale:     Discharge Follow-up:    Post-discharge interventions: NA  Signature  Signe Colt

## 2023-10-09 ENCOUNTER — Ambulatory Visit: Admitting: Internal Medicine

## 2023-10-09 ENCOUNTER — Encounter: Payer: Self-pay | Admitting: Internal Medicine

## 2023-10-09 VITALS — BP 120/84 | HR 67 | Temp 98.6°F | Ht 63.0 in | Wt 172.6 lb

## 2023-10-09 DIAGNOSIS — J22 Unspecified acute lower respiratory infection: Secondary | ICD-10-CM | POA: Diagnosis not present

## 2023-10-09 DIAGNOSIS — H6690 Otitis media, unspecified, unspecified ear: Secondary | ICD-10-CM

## 2023-10-09 MED ORDER — BENZONATATE 100 MG PO CAPS: 100.0000 mg | ORAL_CAPSULE | Freq: Three times a day (TID) | ORAL | 0 refills | Status: DC | PRN

## 2023-10-09 MED ORDER — AMOXICILLIN-POT CLAVULANATE 875-125 MG PO TABS: 1.0000 | ORAL_TABLET | Freq: Two times a day (BID) | ORAL | 0 refills | Status: AC

## 2023-10-09 NOTE — Progress Notes (Signed)
 [facility] [address] [place] [address] Dept: [phone] Dept Fax: [phone]  Acute Care Office Visit  Subjective:   Meghan Mosley 1985-10-29 10/09/2023  Chief Complaint  Patient presents with   Ear Fullness   Cough    Started thursday     HPI: Meghan Mosley is a 38 year old female who complains of congested cough and right ear fullness.  Patient states initially started to feel unwell a week ago Monday.  She started some over-the-counter cold medications at that time.  Tuesday she woke up with a fever of 100.4 Fahrenheit, took Tylenol, and fever resolved and has not returned since.  Friday she noticed her ears were stopped up and was having sinus congestion.she then took Sudafed which did help with the ear fullness in the left ear and sinus congestion, but did not alleviate the right ear.  Cough started on Thursday, and has progressively persisted.   The following portions of the patient's history were reviewed and updated as appropriate: past medical history, past surgical history, family history, social history, allergies, medications, and problem list.   Patient Active Problem List   Diagnosis Date Noted   Gestational hypertension 07/31/2023   Hypertensive emergency 07/29/2023   Neck mass 04/23/2022   Former smoker 04/23/2022   Anxiety 04/23/2022   Abdominal pain 01/10/2022   Pregnancy induced hypertension 06/23/2021   Pediatric pre-birth visit for expectant parent 04/06/2021   Past Medical History:  Diagnosis Date   Anxiety    Hypertension    Gestational   Past Surgical History:  Procedure Laterality Date   CARPAL TUNNEL RELEASE Left    MOUTH SURGERY  07/16/2006   wisdom teeth, have re grown   Family History  Problem Relation Age of Onset   Hypertension Mother    Heart disease Mother    Breast cancer Mother    Hypertension Maternal Grandfather    Heart attack Maternal Grandfather    Breast  cancer Paternal Grandmother    Hypertension Paternal Grandfather    Diabetes Other    Fibroids Other     Current Outpatient Medications:    amoxicillin-clavulanate (AUGMENTIN) 875-125 MG tablet, Take 1 tablet by mouth 2 (two) times daily for 7 days., Disp: 14 tablet, Rfl: 0   benzonatate (TESSALON) 100 MG capsule, Take 1 capsule (100 mg total) by mouth 3 (three) times daily as needed for cough., Disp: 30 capsule, Rfl: 0   Docosahexaenoic Acid (PRENATAL DHA PO), , Disp: , Rfl:  Allergies  Allergen Reactions   Pineapple    Tramadol Diarrhea and Nausea And Vomiting     ROS: A complete ROS was performed with pertinent positives/negatives noted in the HPI. The remainder of the ROS are negative.    Objective:   Today's Vitals   10/09/23 0929  BP: 120/84  Pulse: 67  Temp: 98.6 F (37 C)  TempSrc: Temporal  SpO2: 99%  Weight: 172 lb 9.6 oz (78.3 kg)  Height: 5\' 3"  (1.6 m)    GENERAL: Well-appearing, in NAD. Well nourished.  SKIN: Pink, warm and dry. No rash.  HEENT:    HEAD: Normocephalic, non-traumatic.  EYES: Conjunctive pink without exudate. PERRL.  EARS: External ear w/o redness, swelling, masses, or lesions. EAC clear. RIGHT : TM intact, erythematous with bulging , appropriate landmarks visualized. LEFT: TM intact, translucent w/o bulging.  NOSE: Septum midline w/o deformity. Nares patent, mucosa pink and non-inflamed w/o drainage. No sinus tenderness.  THROAT: Uvula midline. Oropharynx clear. Tonsils non-inflamed w/o  exudate . Mucus membranes pink and moist.  NECK: Trachea midline. Full ROM w/o pain or tenderness. No lymphadenopathy.  RESPIRATORY: Chest wall symmetrical. Respirations even and non-labored. Breath sounds clear to auscultation bilaterally. (+)cough CARDIAC: S1, S2 present, regular rate and rhythm. Peripheral pulses 2+ bilaterally.  EXTREMITIES: Without clubbing, cyanosis, or edema.  NEUROLOGIC: Steady, even gait.  PSYCH/MENTAL STATUS: Alert, oriented x 3.  Cooperative, appropriate mood and affect.    No results found for any visits on 10/09/23.    Assessment & Plan:  1. Acute otitis media, unspecified otitis media type (Primary) - amoxicillin-clavulanate (AUGMENTIN) 875-125 MG tablet; Take 1 tablet by mouth 2 (two) times daily for 7 days.  Dispense: 14 tablet; Refill: 0  2. Lower respiratory infection - amoxicillin-clavulanate (AUGMENTIN) 875-125 MG tablet; Take 1 tablet by mouth 2 (two) times daily for 7 days.  Dispense: 14 tablet; Refill: 0 - benzonatate (TESSALON) 100 MG capsule; Take 1 capsule (100 mg total) by mouth 3 (three) times daily as needed for cough.  Dispense: 30 capsule; Refill: 0 - can take delsym OTC for cough  - patient has held on breastfeeding due to illness an other OTC meds she has been taking, she will continue to hold off until medications completed.   Meds ordered this encounter  Medications   amoxicillin-clavulanate (AUGMENTIN) 875-125 MG tablet    Sig: Take 1 tablet by mouth 2 (two) times daily for 7 days.    Dispense:  14 tablet    Refill:  0    Supervising Provider:   Garnette Gunner [[id number]]   benzonatate (TESSALON) 100 MG capsule    Sig: Take 1 capsule (100 mg total) by mouth 3 (three) times daily as needed for cough.    Dispense:  30 capsule    Refill:  0    Supervising Provider:   Garnette Gunner [[id number]]   No orders of the defined types were placed in this encounter.  Lab Orders  No laboratory test(s) ordered today   No images are attached to the encounter or orders placed in the encounter.  Return if symptoms worsen or fail to improve.   Salvatore Decent, FNP

## 2023-10-09 NOTE — Patient Instructions (Signed)
 Can use Delsym over the counter  No breastfeeding while taking medications.

## 2023-11-04 ENCOUNTER — Encounter: Payer: Self-pay | Admitting: Internal Medicine

## 2023-11-04 ENCOUNTER — Ambulatory Visit: Payer: Self-pay | Admitting: *Deleted

## 2023-11-04 ENCOUNTER — Ambulatory Visit: Admitting: Internal Medicine

## 2023-11-04 ENCOUNTER — Ambulatory Visit: Attending: Internal Medicine

## 2023-11-04 VITALS — BP 132/88 | HR 84 | Temp 98.4°F | Ht 63.0 in | Wt 204.3 lb

## 2023-11-04 DIAGNOSIS — R002 Palpitations: Secondary | ICD-10-CM | POA: Diagnosis not present

## 2023-11-04 LAB — CBC WITH DIFFERENTIAL/PLATELET
Basophils Absolute: 0 10*3/uL (ref 0.0–0.1)
Basophils Relative: 0.3 % (ref 0.0–3.0)
Eosinophils Absolute: 0.1 10*3/uL (ref 0.0–0.7)
Eosinophils Relative: 0.5 % (ref 0.0–5.0)
HCT: 41 % (ref 36.0–46.0)
Hemoglobin: 13.6 g/dL (ref 12.0–15.0)
Lymphocytes Relative: 22.6 % (ref 12.0–46.0)
Lymphs Abs: 2.2 10*3/uL (ref 0.7–4.0)
MCHC: 33.2 g/dL (ref 30.0–36.0)
MCV: 86.2 fl (ref 78.0–100.0)
Monocytes Absolute: 0.6 10*3/uL (ref 0.1–1.0)
Monocytes Relative: 5.7 % (ref 3.0–12.0)
Neutro Abs: 6.9 10*3/uL (ref 1.4–7.7)
Neutrophils Relative %: 70.9 % (ref 43.0–77.0)
Platelets: 254 10*3/uL (ref 150.0–400.0)
RBC: 4.76 Mil/uL (ref 3.87–5.11)
RDW: 15.4 % (ref 11.5–15.5)
WBC: 9.7 10*3/uL (ref 4.0–10.5)

## 2023-11-04 LAB — BASIC METABOLIC PANEL WITH GFR
BUN: 13 mg/dL (ref 6–23)
CO2: 27 meq/L (ref 19–32)
Calcium: 9.5 mg/dL (ref 8.4–10.5)
Chloride: 101 meq/L (ref 96–112)
Creatinine, Ser: 0.8 mg/dL (ref 0.40–1.20)
GFR: 95.37 mL/min (ref >60.00)
Glucose, Bld: 91 mg/dL (ref 70–99)
Potassium: 3.7 meq/L (ref 3.5–5.1)
Sodium: 138 meq/L (ref 135–145)

## 2023-11-04 LAB — TSH: TSH: 0.98 u[IU]/mL (ref 0.35–5.50)

## 2023-11-04 NOTE — Progress Notes (Unsigned)
 EP to read.

## 2023-11-04 NOTE — Progress Notes (Signed)
 [facility] [address] [place] [address] Dept: [phone] Dept Fax: [phone]  Acute Care Office Visit  Subjective:   Meghan Mosley [birth year] 11/04/2023  Chief Complaint  Patient presents with   Palpitations    HPI: Discussed the use of AI scribe software for clinical note transcription with the patient, who gave verbal consent to proceed.  History of Present Illness   Meghan Mosley is a 38 year old female who presents with palpitations.  She experiences palpitations described as a 'quick little flutter' in her chest, occurring every two to three days over the past two weeks. These episodes last between 30 to 90 seconds and are sometimes accompanied by a flushed feeling and a need to catch her breath. No chest pain or syncope during these episodes.  She has a history of gestational hypertension and is concerned about her blood pressure, which was 132/88 mmHg at the last check. She has not been monitoring her blood pressure at home but notes that it has been normal at recent doctor's appointments. She associates high sodium intake with elevated blood pressure and is actively trying to lose weight by tracking her diet.  She has experienced hand swelling, particularly in the mornings, which she attributes to weight gain and possibly high sodium intake. She has not noticed any leg swelling.  She has a family history of hypertension, with her mother having hypertension and thyroid  issues, and her grandfather having died in his mid-fifties. She is a former smoker, having only tried smoking in her early twenties, and currently does not smoke.  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 She feels that her anxiety has decreased since consuming these drinks.       The following portions of the patient's history were  reviewed and updated as appropriate: past medical history, past surgical history, family history, social history, allergies, medications, and problem list.   Patient Active Problem List   Diagnosis Date Noted   Gestational hypertension 07/31/2023   Hypertensive emergency 07/29/2023   Neck mass 04/23/2022   Former smoker 04/23/2022   Anxiety 04/23/2022   Abdominal pain 01/10/2022   Pregnancy induced hypertension 06/23/2021   Pediatric pre-birth visit for expectant parent 04/06/2021   Past Medical History:  Diagnosis Date   Anxiety    Hypertension    Gestational   Past Surgical History:  Procedure Laterality Date   CARPAL TUNNEL RELEASE Left    MOUTH SURGERY  07/16/2006   wisdom teeth, have re grown   Family History  Problem Relation Age of Onset   Hypertension Mother    Heart disease Mother    Breast cancer Mother    Hypertension Maternal Grandfather    Heart attack Maternal Grandfather    Breast cancer Paternal Grandmother    Hypertension Paternal Grandfather    Diabetes Other    Fibroids Other    No current outpatient medications on file. Allergies  Allergen Reactions   Pineapple    Tramadol  Diarrhea and Nausea And Vomiting     ROS: A complete ROS was performed with pertinent positives/negatives noted in the HPI. The remainder of the ROS are negative.    Objective:   Today's Vitals   11/04/23 1338  BP: 132/88  Pulse: 84  Temp: 98.4 F (36.9 C)  TempSrc: Temporal  SpO2: 96%  Weight: 204 lb 4.8 oz (92.7 kg)  Height: 5\' 3"  (1.6 m)  BP Readings from Last 3 Encounters:  11/04/23 132/88  10/09/23 120/84  08/02/23 133/87     GENERAL: Well-appearing, in NAD. Well nourished.  SKIN: Pink, warm and dry. No rash, lesion, ulceration, or ecchymoses.  NECK: Trachea midline. Full ROM w/o pain or tenderness. No lymphadenopathy.  RESPIRATORY: Chest wall symmetrical. Respirations even and non-labored. Breath sounds clear to auscultation bilaterally.  CARDIAC: S1,  S2 present, regular rate and rhythm. Peripheral pulses 2+ bilaterally.  EXTREMITIES: Without clubbing, cyanosis, or edema.  NEUROLOGIC: No motor or sensory deficits. Steady, even gait.  PSYCH/MENTAL STATUS: Alert, oriented x 3. Cooperative, appropriate mood and affect.   EKG RESULT: EKG tracing is personally reviewed.   EKG: normal sinus rhythm.  EKG reviewed with Dr. Alane Hsu MD    No results found for any visits on 11/04/23.    Assessment & Plan:  Assessment and Plan    Palpitations Intermittent palpitations with flushing and mild lightheadedness. Increased frequency over two weeks. Differential includes anxiety, electrolyte imbalance, thyroid  dysfunction, or arrhythmia.  - EKG - Order TSH, BMP, and CBC. - Order Ziopatch monitor for 14 days. - Follow up if symptoms worsen or fail to improve.       No orders of the defined types were placed in this encounter.  Orders Placed This Encounter  Procedures   TSH   Basic Metabolic Panel (BMET)   CBC w/Diff   LONG TERM MONITOR (3-14 DAYS)    Standing Status:   Future    Number of Occurrences:   1    Expiration Date:   11/03/2024    Where should this test be performed?:   [facility]    Does the patient have an implanted cardiac device?:   No    Prescribed days of wear:   14    Type of enrollment:   Home Enrollment    Vendor::   [employer]   EKG 12-Lead   Lab Orders         TSH         Basic Metabolic Panel (BMET)         CBC w/Diff     No images are attached to the encounter or orders placed in the encounter.  Return if symptoms worsen or fail to improve.   Gavin Kast, FNP

## 2023-11-04 NOTE — Telephone Encounter (Signed)
  Chief Complaint: "fluttering " feeling in chest at times Symptoms: palpitation- lasting seconds, comes and goes, 2 weeks, feels flushed, swimmy headed when occurs  Frequency: 2 weeks Pertinent Negatives: Patient denies chest pain, SOB Disposition: [] ED /[] Urgent Care (no appt availability in office) / [x] Appointment(In office/virtual)/ []  [facility]/ [] Home Care/ [] Refused Recommended Disposition /[] [facility]/ []  Follow-up with PCP Additional Notes: Appointment scheduled    Copied from CRM [id number]. Topic: Clinical - Red Word Triage >> Nov 04, 2023 11:08 AM Bambi Bonine D wrote: Red Word that prompted transfer to Nurse Triage: anxiety  Patient stated that she is experiencing fluttering in her chest and sometimes will make her face flushed. Patient stated that she thinks it may have something to do with her anxiety. Reason for Disposition  [1] Palpitations AND [2] no improvement after using Care Advice  Answer Assessment - Initial Assessment Questions 1. DESCRIPTION: "Please describe your heart rate or heartbeat that you are having" (e.g., fast/slow, regular/irregular, skipped or extra beats, "palpitations")     Fluttering effect 2. ONSET: "When did it start?" (Minutes, hours or days)      2 weeks 3. DURATION: "How long does it last" (e.g., seconds, minutes, hours)     Less than 1 minute 4. PATTERN "Does it come and go, or has it been constant since it started?"  "Does it get worse with exertion?"   "Are you feeling it now?"     Comes and goes, late in evening, not every day 5. TAP: "Using your hand, can you tap out what you are feeling on a chair or table in front of you, so that I can hear?" (Note: not all patients can do this)       na 6. HEART RATE: "Can you tell me your heart rate?" "How many beats in 15 seconds?"  (Note: not all patients can do this)       no 7. RECURRENT SYMPTOM: "Have you ever had this before?" If Yes, ask: "When was the last time?" and "What  happened that time?"      No- felt something similar with anxiety  8. CAUSE: "What do you think is causing the palpitations?"     Post pregnancy symptoms- hx pregnancy induced hypertension  9. CARDIAC HISTORY: "Do you have any history of heart disease?" (e.g., heart attack, angina, bypass surgery, angioplasty, arrhythmia)      Pregnancy induced hypertension- post deleivery now 10. OTHER SYMPTOMS: "Do you have any other symptoms?" (e.g., dizziness, chest pain, sweating, difficulty breathing)       Anxiety, "swimmy headed" at times, flushed  Protocols used: Heart Rate and Heartbeat Questions-A-AH

## 2023-11-04 NOTE — Telephone Encounter (Signed)
 Noted. Pt scheduled With Family Medicine Gavin Kast, FNP) 11/04/2023 at 1:40 PM

## 2023-11-05 ENCOUNTER — Encounter: Payer: Self-pay | Admitting: Internal Medicine

## 2023-11-07 ENCOUNTER — Encounter: Admitting: Internal Medicine

## 2023-11-28 DIAGNOSIS — R002 Palpitations: Secondary | ICD-10-CM | POA: Diagnosis not present

## 2023-12-16 ENCOUNTER — Encounter: Admitting: Family Medicine

## 2023-12-17 ENCOUNTER — Ambulatory Visit: Payer: Self-pay | Admitting: Internal Medicine

## 2024-01-07 ENCOUNTER — Ambulatory Visit: Admitting: Family Medicine

## 2024-01-07 ENCOUNTER — Encounter: Payer: Self-pay | Admitting: Family Medicine

## 2024-01-07 VITALS — BP 110/70 | HR 68 | Temp 98.3°F | Ht 63.5 in | Wt 203.6 lb

## 2024-01-07 DIAGNOSIS — F418 Other specified anxiety disorders: Secondary | ICD-10-CM | POA: Diagnosis not present

## 2024-01-07 DIAGNOSIS — Z6835 Body mass index (BMI) 35.0-35.9, adult: Secondary | ICD-10-CM

## 2024-01-07 DIAGNOSIS — E66812 Obesity, class 2: Secondary | ICD-10-CM | POA: Diagnosis not present

## 2024-01-07 DIAGNOSIS — I493 Ventricular premature depolarization: Secondary | ICD-10-CM | POA: Diagnosis not present

## 2024-01-07 DIAGNOSIS — E6609 Other obesity due to excess calories: Secondary | ICD-10-CM

## 2024-01-07 DIAGNOSIS — Z6836 Body mass index (BMI) 36.0-36.9, adult: Secondary | ICD-10-CM | POA: Insufficient documentation

## 2024-01-07 DIAGNOSIS — O906 Postpartum mood disturbance: Secondary | ICD-10-CM

## 2024-01-07 DIAGNOSIS — O99345 Other mental disorders complicating the puerperium: Secondary | ICD-10-CM

## 2024-01-07 HISTORY — DX: Obesity, class 2: E66.812

## 2024-01-07 HISTORY — DX: Other obesity due to excess calories: E66.09

## 2024-01-07 HISTORY — DX: Postpartum mood disturbance: O90.6

## 2024-01-07 NOTE — Progress Notes (Signed)
 Assessment & Plan   Assessment/Plan:  Total time spent caring for the patient today was 41 minutes. This includes time spent before the visit reviewing the chart, time spent during the visit, and time spent after the visit on documentation, etc.    Assessment & Plan Postpartem Anxiety and Mood Disturbance with insomnia Anxiety is contributing to heart palpitations and sleep disturbances. She is five months postpartum, experiencing significant life changes, including a new job and managing two young children, which may exacerbate anxiety. She has been using THC products for self-management, which can have paradoxical effects on mood. Discussed potential benefits of SSRIs, particularly sertraline, which is considered safe in postpartum and pregnancy, and zuranolone, a newer agent for postpartum mood disorders. She expressed concerns about medication dependence but is open to discussing options with her OB-GYN. Emphasized that while [facility] can provide temporary relief, it is not a long-term solution and can have negative consequences. - Discuss postpartum medication management with OB-GYN, including options like sertraline and zuranolone. - Consider starting sertraline 50 mg orally for postpartum anxiety if recommended by OB-GYN.  Heart Palpitations Heart palpitations have been evaluated with a Zio patch, which showed PVCs but no concerning arrhythmias. Palpitations are likely related to anxiety. She prefers to address anxiety rather than starting a beta blocker, which has its own side effects. - Address anxiety as a primary approach to managing palpitations.  Weight Management with BMI 35.5 She is experiencing difficulty with weight management postpartum, affecting her self-image and potentially contributing to knee pain. Despite an active lifestyle, she is not seeing progress in weight loss, which may be related to anxiety and stress. Suggested focusing on anxiety management first before addressing  weight management. - Focus on managing anxiety before addressing weight management.  General Health Maintenance She is interested in an overall physical examination to assess her health status postpartum. Previous lab work, including thyroid  function tests, was normal. She is concerned about her thyroid  due to family history but recent TSH levels were well-managed. Further thyroid  testing is not indicated at this time given well-managed results. - Perform a general physical examination and lab work to assess overall health status postpartum.  Follow-up She plans to discuss medication options with her OB-GYN and is interested in follow-up for weight management once anxiety is addressed. - Follow up with OB-GYN to discuss medication options for anxiety. - Schedule follow-up appointment for weight management discussions after anxiety is addressed.      There are no discontinued medications.          Subjective:   Encounter date: 01/07/2024  Meghan Mosley is a 38 y.o. female who has Pediatric pre-birth visit for expectant parent; Pregnancy induced hypertension; Abdominal pain; Neck mass; Former smoker; Postpartum anxiety; Hypertensive emergency; Gestational hypertension; PVC's (premature ventricular contractions); Class 2 obesity due to excess calories without serious comorbidity with body mass index (BMI) of 35.0 to 35.9 in adult; and Postpartum mood disturbance on their problem list..   She  has a past medical history of Anxiety and Hypertension..   She presents with chief complaint of Annual Exam (Fasting Mrytle Bento thyroid  Pasquale management /Heart ) .   Discussed the use of AI scribe software for clinical note transcription with the patient, who gave verbal consent to proceed.  History of Present Illness Meghan Mosley is a 38 year old who presents with concerns about thyroid  issues, weight management, and heart palpitations.  She is five months postpartum and has been  experiencing difficulty losing weight despite maintaining a  healthy diet and regular exercise. 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 The challenge in losing weight is affecting her self-image.  She experiences heart palpitations that are sudden and concerning. She has undergone blood work and a Zio patch for heart monitoring. Her TSH levels were normal, and the Zio patch showed premature ventricular contractions (PVCs), which she finds disruptive. Her blood pressure has been stable.  She has a history of anxiety, which she manages with therapy. Her anxiety is generally under control, but she experiences increased anxiety and tearfulness, particularly around her menstrual cycle. She uses THC products to aid sleep and reduce anxiety, noting they are effective for a few days. She consumes minimal alcohol, only sampling products for work.  There is a family history of thyroid  issues, as both her mother and grandmother have experienced similar problems.     01/07/2024    9:55 AM 10/09/2023    9:28 AM 06/19/2023   10:53 AM 04/23/2022    2:18 PM 01/10/2022   11:03 AM  Depression screen PHQ 2/9  Decreased Interest 0 0 0 0 0  Down, Depressed, Hopeless 0 0 0 1 0  PHQ - 2 Score 0 0 0 1 0  Altered sleeping 0   0 1  Tired, decreased energy 2   1 0  Change in appetite 1   1 1   Feeling bad or failure about yourself  0   1 0  Trouble concentrating 1   0 0  Moving slowly or fidgety/restless 0   0 0  Suicidal thoughts 0   0 0  PHQ-9 Score 4   4 2   Difficult doing work/chores Not difficult at all   Somewhat difficult Not difficult at all      ROS  Past Surgical History:  Procedure Laterality Date   CARPAL TUNNEL RELEASE Left    MOUTH SURGERY  07/16/2006   wisdom teeth, have re grown    Outpatient Medications Prior to Visit  Medication Sig Dispense Refill   Multiple Vitamin  (MULTIVITAMIN) tablet Take 1 tablet by mouth daily.     No facility-administered medications prior to visit.    Family History  Problem Relation Age of Onset   Hypertension Mother    Heart disease Mother    Breast cancer Mother    Hypertension Maternal Grandfather    Heart attack Maternal Grandfather    Breast cancer Paternal Grandmother    Hypertension Paternal Grandfather    Diabetes Other    Fibroids Other     Social History   Socioeconomic History   Marital status: Married    Spouse name: Not on file   Number of children: Not on file   Years of education: Not on file   Highest education level: Bachelor's degree (e.g., BA, AB, BS)  Occupational History   Not on file  Tobacco Use   Smoking status: Never    Passive exposure: Never   Smokeless tobacco: Never  Vaping Use   Vaping status: Never Used  Substance and Sexual Activity   Alcohol use: Not Currently    Comment: social   Drug use: No   Sexual activity: Yes  Other Topics Concern   Not on file  Social History Narrative   Not on file   Social Drivers of Health   Financial Resource Strain: Low Risk  (01/03/2024)   Overall Financial Resource  Strain (CARDIA)    Difficulty of Paying Living Expenses: Not hard at all  Food Insecurity: No Food Insecurity (01/03/2024)   Hunger Vital Sign    Worried About Running Out of Food in the Last Year: Never true    Ran Out of Food in the Last Year: Never true  Transportation Needs: No Transportation Needs (01/03/2024)   PRAPARE - Administrator, Civil Service (Medical): No    Lack of Transportation (Non-Medical): No  Physical Activity: Insufficiently Active (01/03/2024)   Exercise Vital Sign    Days of Exercise per Week: 3 days    Minutes of Exercise per Session: 20 min  Stress: No Stress Concern Present (01/03/2024)   [employer] of Occupational Health - Occupational Stress Questionnaire    Feeling of Stress: Only a little  Social Connections: Moderately  Isolated (01/03/2024)   Social Connection and Isolation Panel    Frequency of Communication with Friends and Family: More than three times a week    Frequency of Social Gatherings with Friends and Family: Three times a week    Attends Religious Services: Never    Active Member of Clubs or Organizations: No    Attends Banker Meetings: Not on file    Marital Status: Married  Intimate Partner Violence: Not At Risk (07/29/2023)   Humiliation, Afraid, Rape, and Kick questionnaire    Fear of Current or Ex-Partner: No    Emotionally Abused: No    Physically Abused: No    Sexually Abused: No                                                                                                  Objective:  Physical Exam: BP 110/70   Pulse 68   Temp 98.3 F (36.8 C) (Temporal)   Ht 5' 3.5 (1.613 m)   Wt 203 lb 9.6 oz (92.4 kg)   LMP 01/07/2024   SpO2 99%   BMI 35.50 kg/m   Wt Readings from Last 3 Encounters:  01/07/24 203 lb 9.6 oz (92.4 kg)  11/04/23 204 lb 4.8 oz (92.7 kg)  10/09/23 172 lb 9.6 oz (78.3 kg)     Physical Exam GENERAL: Alert, cooperative, well developed, no acute distress HEENT: Normocephalic, normal oropharynx, moist mucous membranes CHEST: Clear to auscultation bilaterally, No wheezes, rhonchi, or crackles CARDIOVASCULAR: Normal heart rate and rhythm, S1 and S2 normal without murmurs ABDOMEN: Soft, non-tender, non-distended, without organomegaly, Normal bowel sounds EXTREMITIES: No cyanosis or edema NEUROLOGICAL: Cranial nerves grossly intact, Moves all extremities without gross motor or sensory deficit   Physical Exam  LONG TERM MONITOR (3-14 DAYS) Result Date: 11/28/2023 Monitoring period: 13d 14 hours HR 50 - 157, average 81 bpm.  No atrial fibrillation detected. Rare supraventricular ectopy. Rare ventricular ectopy. No sustained arrhythmias. Symptom trigger episodes correspond to isolated PVCs [facility]     Recent Results (from the past 2160 hours)  TSH     Status: None   Collection Time: 11/04/23  2:25 PM  Result Value Ref Range   TSH 0.98 0.35 - 5.50 uIU/mL  Basic Metabolic Panel (BMET)     Status: None   Collection Time: 11/04/23  2:25 PM  Result Value Ref Range   Sodium 138 135 - 145 mEq/L   Potassium 3.7 3.5 - 5.1 mEq/L   Chloride 101 96 - 112 mEq/L   CO2 27 19 - 32 mEq/L   Glucose, Bld 91 70 - 99 mg/dL   BUN 13 6 - 23 mg/dL   Creatinine, Ser 9.19 0.40 - 1.20 mg/dL   GFR 04.62 >39.99 mL/min    Comment: Calculated using the CKD-EPI Creatinine Equation (2021)   Calcium  9.5 8.4 - 10.5 mg/dL  CBC w/Diff     Status: None   Collection Time: 11/04/23  2:25 PM  Result Value Ref Range   WBC 9.7 4.0 - 10.5 K/uL   RBC 4.76 3.87 - 5.11 Mil/uL   Hemoglobin 13.6 12.0 - 15.0 g/dL   HCT 58.9 63.9 - 53.9 %   MCV 86.2 78.0 - 100.0 fl   MCHC 33.2 30.0 - 36.0 g/dL   RDW 84.5 88.4 - 84.4 %   Platelets 254.0 150.0 - 400.0 K/uL   Neutrophils Relative % 70.9 43.0 - 77.0 %   Lymphocytes Relative 22.6 12.0 - 46.0 %   Monocytes Relative 5.7 3.0 - 12.0 %   Eosinophils Relative 0.5 0.0 - 5.0 %   Basophils Relative 0.3 0.0 - 3.0 %   Neutro Abs 6.9 1.4 - 7.7 K/uL   Lymphs Abs 2.2 0.7 - 4.0 K/uL   Monocytes Absolute 0.6 0.1 - 1.0 K/uL   Eosinophils Absolute 0.1 0.0 - 0.7 K/uL   Basophils Absolute 0.0 0.0 - 0.1 K/uL        Beverley Adine Hummer, MD, MS

## 2024-02-25 ENCOUNTER — Ambulatory Visit: Admitting: Nurse Practitioner

## 2024-02-25 ENCOUNTER — Encounter: Payer: Self-pay | Admitting: Nurse Practitioner

## 2024-02-25 VITALS — BP 124/80 | HR 77 | Ht 63.5 in | Wt 210.0 lb

## 2024-02-25 DIAGNOSIS — E6609 Other obesity due to excess calories: Secondary | ICD-10-CM

## 2024-02-25 DIAGNOSIS — Z0000 Encounter for general adult medical examination without abnormal findings: Secondary | ICD-10-CM

## 2024-02-25 DIAGNOSIS — I493 Ventricular premature depolarization: Secondary | ICD-10-CM | POA: Diagnosis not present

## 2024-02-25 DIAGNOSIS — Z833 Family history of diabetes mellitus: Secondary | ICD-10-CM

## 2024-02-25 DIAGNOSIS — R5383 Other fatigue: Secondary | ICD-10-CM | POA: Diagnosis not present

## 2024-02-25 DIAGNOSIS — Z8349 Family history of other endocrine, nutritional and metabolic diseases: Secondary | ICD-10-CM | POA: Diagnosis not present

## 2024-02-25 DIAGNOSIS — R052 Subacute cough: Secondary | ICD-10-CM | POA: Insufficient documentation

## 2024-02-25 DIAGNOSIS — E66812 Obesity, class 2: Secondary | ICD-10-CM | POA: Diagnosis not present

## 2024-02-25 DIAGNOSIS — Z6836 Body mass index (BMI) 36.0-36.9, adult: Secondary | ICD-10-CM

## 2024-02-25 HISTORY — DX: Other fatigue: R53.83

## 2024-02-25 HISTORY — DX: Subacute cough: R05.2

## 2024-02-25 NOTE — Assessment & Plan Note (Signed)
 Chronic daytime fatigue despite full nights rest without causative factor present. No correlation with sleep aids. Denies chronic snoring or gasping for air in the night. Labs pending for further evaluation.

## 2024-02-25 NOTE — Assessment & Plan Note (Signed)

## 2024-02-25 NOTE — Progress Notes (Signed)
 Meghan Mosley Doing, DNP, AGNP-c [facility] [address] [place], [place] [address] Main Office [phone]   New patient visit   Patient: Meghan Mosley   DOB: 01/11/1986   37 y.o. Female  MRN: [id number] Visit Date: 02/25/2024  Patient Care Team: Jacqualynn Parco, Meghan FORBES, NP as PCP - General (Nurse Practitioner) Dannielle Bouchard, DO as Consulting Physician (Obstetrics and Gynecology)  Today's Vitals   02/25/24 1403  BP: 124/80  Pulse: 77  Weight: 210 lb (95.3 kg)  Height: 5' 3.5 (1.613 m)   Body mass index is 36.62 kg/m.   Today's healthcare provider: Camie CHARLENA Doing, NP   Chief Complaint  Patient presents with   other    New pt. Est. 7 months postpartum and had hand surgery, check thyroid , family history of thyroid  issues, and then weight loss, weight staying the same even with diet, obgyn Bouchard Dannielle, last PAP with in a few years,    Subjective    History of Present Illness Meghan Mosley is a 38 year old who presents as a new patient for a physical exam and evaluation of fatigue and weight management.  She recently underwent carpal tunnel release surgery on her left hand, which was delayed due to pregnancy. She reports that for the first time in over a decade, she has been able to sleep through the night without pain. She was diagnosed with carpal tunnel syndrome in college, and symptoms have worsened over the past few years. She previously had carpal tunnel release surgery on her right hand about a year ago. She is doing well since this procedure.   She experiences chest fluttering and palpitations postpartum with both her son and daughter. An EKG and a 14-day heart monitor showed normal results, and her TSH levels were checked, showing low-normal range. She has a family history of thyroid  issues, with her mother-in-law having had her thyroid  removed. She has been experiencing fatigue, weight loss difficulties, and has been excessively tired despite sleeping well  through the night. Due to her symptoms, family has encouraged her to have further thyroid  testing completed. Review of TSH shows normal findings previously at 0.98 (ref range 0.35-5.50).  She has been tracking her calories and trying to be physically active but feels she is not making progress with weight loss. There is a family history of type 1 diabetes, with her great aunt diagnosed in her thirties or forties, but no other family members that she is aware of.   She experiences constipation a couple of times a month, which she attributes to postpartum hormonal changes. She manages it with increased fiber, water intake, and occasional use of Miralax.  She had gestational hypertension during both pregnancies, requiring treatment with magnesium  and labetalol  during her first pregnancy. Her blood pressure returned to normal postpartum. Her daughter was born at 42 weeks due to rising blood pressure and norovirus infection, which led to Tenna Lacko delivery. Her HTN resolved spontaneously following this delivery, as well.   She reports a persistent cough for three months, which worsens in the morning and evening, following an illness brought home by her son. She does not typically produce sputum with the cough. She denies wheezing or fevers.She reports her husband has recently started to come down with something again.   She takes a multivitamin daily and occasionally uses THC/CBN gummies to aid sleep. She has been experiencing fatigue despite sleeping well and has noticed increased tiredness recently. She experiences fatigue with and without use of gummies.  History reviewed and reveals the following: Past Medical History:  Diagnosis Date   Anxiety    Class 2 obesity due to excess calories without serious comorbidity with body mass index (BMI) of 35.0 to 35.9 in adult 01/07/2024   Gestational hypertension 07/31/2023   Hypertension    Gestational   Hypertensive emergency 07/29/2023   Pediatric  pre-birth visit for expectant parent 04/06/2021   Postpartum anxiety 04/23/2022   Postpartum mood disturbance 01/07/2024   Pregnancy induced hypertension 06/23/2021   Past Surgical History:  Procedure Laterality Date   CARPAL TUNNEL RELEASE Left    MOUTH SURGERY  07/16/2006   wisdom teeth, have re grown   Family Status  Relation Name Status   Mother  Alive   Father  Alive   Sister  Alive   MGF  Deceased   PGM  (Not Specified)   PGF  (Not Specified)   Other MGA (Not Specified)   Other mat side (Not Specified)  No partnership data on file   Family History  Problem Relation Age of Onset   Hypertension Mother    Heart disease Mother    Breast cancer Mother    Hypertension Maternal Grandfather    Heart attack Maternal Grandfather    Breast cancer Paternal Grandmother    Hypertension Paternal Grandfather    Diabetes Other    Fibroids Other    Social History   Socioeconomic History   Marital status: Married    Spouse name: Not on file   Number of children: Not on file   Years of education: Not on file   Highest education level: Bachelor's degree (e.g., BA, AB, BS)  Occupational History   Not on file  Tobacco Use   Smoking status: Never    Passive exposure: Never   Smokeless tobacco: Never  Vaping Use   Vaping status: Never Used  Substance and Sexual Activity   Alcohol use: Not Currently    Comment: social   Drug use: No   Sexual activity: Yes  Other Topics Concern   Not on file  Social History Narrative   Not on file   Social Drivers of Health   Financial Resource Strain: Low Risk  (01/03/2024)   Overall Financial Resource Strain (CARDIA)    Difficulty of Paying Living Expenses: Not hard at all  Food Insecurity: No Food Insecurity (01/03/2024)   Hunger Vital Sign    Worried About Running Out of Food in the Last Year: Never true    Ran Out of Food in the Last Year: Never true  Transportation Needs: No Transportation Needs (01/03/2024)   PRAPARE -  Administrator, Civil Service (Medical): No    Lack of Transportation (Non-Medical): No  Physical Activity: Insufficiently Active (01/03/2024)   Exercise Vital Sign    Days of Exercise per Week: 3 days    Minutes of Exercise per Session: 20 min  Stress: No Stress Concern Present (01/03/2024)   [employer] of Occupational Health - Occupational Stress Questionnaire    Feeling of Stress: Only a little  Social Connections: Moderately Isolated (01/03/2024)   Social Connection and Isolation Panel    Frequency of Communication with Friends and Family: More than three times a week    Frequency of Social Gatherings with Friends and Family: Three times a week    Attends Religious Services: Never    Active Member of Clubs or Organizations: No    Attends Banker Meetings: Not on file    Marital  Status: Married   Outpatient Medications Prior to Visit  Medication Sig   Multiple Vitamin (MULTIVITAMIN) tablet Take 1 tablet by mouth daily.   No facility-administered medications prior to visit.   Allergies  Allergen Reactions   Nickel Hives   Pineapple    Tramadol  Diarrhea and Nausea And Vomiting   Immunization History  Administered Date(s) Administered    sv, Bivalent, Protein Subunit Rsvpref,pf [place]) 07/04/2023   Influenza Inj Mdck Quad Pf 04/28/2021   Influenza,inj,Quad PF,6+ Mos 03/24/2019, 04/23/2022   Influenza,trivalent, recombinat, inj, PF 04/01/2023   Influenza-Unspecified 04/20/2021, 04/23/2022, 03/28/2023   PFIZER(Purple Top)SARS-COV-2 Vaccination 10/16/2019, 11/10/2019   Tdap 03/31/2021, 05/22/2023    Health Maintenance Due Health Maintenance Topics with due status: Overdue     Topic Date Due   COVID-19 Vaccine 03/17/2023    Review of Systems All review of systems negative except what is listed in the HPI   Objective    BP 124/80   Pulse 77   Ht 5' 3.5 (1.613 m)   Wt 210 lb (95.3 kg)   Breastfeeding No   BMI 36.62 kg/m  Physical  Exam Vitals and nursing note reviewed.  Constitutional:      General: She is not in acute distress.    Appearance: Normal appearance.  HENT:     Head: Normocephalic and atraumatic.     Right Ear: Hearing, tympanic membrane, ear canal and external ear normal.     Left Ear: Hearing, tympanic membrane, ear canal and external ear normal.     Nose: Nose normal.     Right Sinus: No maxillary sinus tenderness or frontal sinus tenderness.     Left Sinus: No maxillary sinus tenderness or frontal sinus tenderness.     Mouth/Throat:     Lips: Pink.     Mouth: Mucous membranes are moist.     Pharynx: Oropharynx is clear.  Eyes:     General: Lids are normal. Vision grossly intact.     Extraocular Movements: Extraocular movements intact.     Conjunctiva/sclera: Conjunctivae normal.     Pupils: Pupils are equal, round, and reactive to light.     Funduscopic exam:    Right eye: Red reflex present.        Left eye: Red reflex present.    Visual Fields: Right eye visual fields normal and left eye visual fields normal.  Neck:     Thyroid : No thyromegaly.     Vascular: No carotid bruit.  Cardiovascular:     Rate and Rhythm: Normal rate and regular rhythm.     Chest Wall: PMI is not displaced.     Pulses: Normal pulses.          Dorsalis pedis pulses are 2+ on the right side and 2+ on the left side.       Posterior tibial pulses are 2+ on the right side and 2+ on the left side.     Heart sounds: Normal heart sounds. No murmur heard. Pulmonary:     Effort: Pulmonary effort is normal. No respiratory distress.     Breath sounds: Normal breath sounds.  Abdominal:     General: Abdomen is flat. Bowel sounds are normal. There is no distension.     Palpations: Abdomen is soft. There is no hepatomegaly, splenomegaly or mass.     Tenderness: There is no abdominal tenderness. There is no right CVA tenderness, left CVA tenderness, guarding or rebound.  Musculoskeletal:        General: Normal range  of  motion.     Cervical back: Full passive range of motion without pain, normal range of motion and neck supple. No tenderness.     Right lower leg: No edema.     Left lower leg: No edema.  Feet:     Left foot:     Toenail Condition: Left toenails are normal.  Lymphadenopathy:     Cervical: No cervical adenopathy.     Upper Body:     Right upper body: No supraclavicular adenopathy.     Left upper body: No supraclavicular adenopathy.  Skin:    General: Skin is warm and dry.     Capillary Refill: Capillary refill takes less than 2 seconds.     Nails: There is no clubbing.  Neurological:     General: No focal deficit present.     Mental Status: She is alert and oriented to person, place, and time.     GCS: GCS eye subscore is 4. GCS verbal subscore is 5. GCS motor subscore is 6.     Sensory: Sensation is intact.     Motor: Motor function is intact.     Coordination: Coordination is intact.     Gait: Gait is intact.     Deep Tendon Reflexes: Reflexes are normal and symmetric.  Psychiatric:        Attention and Perception: Attention normal.        Mood and Affect: Mood normal.        Speech: Speech normal.        Behavior: Behavior normal. Behavior is cooperative.        Thought Content: Thought content normal.        Cognition and Memory: Cognition and memory normal.        Judgment: Judgment normal.      No results found for any visits on 02/25/24.  Assessment & Plan      Problem List Items Addressed This Visit     PVC's (premature ventricular contractions)   Discovered on cardiac monitor. Very short durations and infrequent. Will monitor TSH and FT4 today to r/o thyroid  etiology. Discussed today that this is a common benign finding present in most adults.  -If symptoms worsen or become frequent, please let me know. - Will monitor labs      Relevant Orders   TSH + free T4   Hemoglobin A1c   CBC with Differential/Platelet   Comprehensive metabolic panel with GFR   Iron,  TIBC and Ferritin Panel   VITAMIN D  25 Hydroxy (Vit-D Deficiency, Fractures)   Vitamin B12   Insulin , random   Class 2 obesity due to excess calories with body mass index (BMI) of 36.0 to 36.9 in adult   Difficulty losing weight despite diet and exercise. 7 months post partum and no longer breast feeding. Gestational hypertension history. Concerns about thyroid  function. Family history of type 1 diabetes and thyroid  issues. No current medications except multivitamin. - Order labs: TSH, T4, CBC for anemia, B12, vitamin D , and insulin  levels. - Discuss potential use of phentermine for weight loss after reviewing lab results. Will need close monitoring for blood pressure and heart rate given history.  - Consider GLP-1 if diabetes is noted.        Relevant Orders   TSH + free T4   Hemoglobin A1c   CBC with Differential/Platelet   Comprehensive metabolic panel with GFR   Iron, TIBC and Ferritin Panel   VITAMIN D  25 Hydroxy (Vit-D Deficiency, Fractures)  Vitamin B12   Insulin , random   Fatigue   Chronic daytime fatigue despite full nights rest without causative factor present. No correlation with sleep aids. Denies chronic snoring or gasping for air in the night. Labs pending for further evaluation.       Relevant Orders   TSH + free T4   Hemoglobin A1c   CBC with Differential/Platelet   Comprehensive metabolic panel with GFR   Iron, TIBC and Ferritin Panel   VITAMIN D  25 Hydroxy (Vit-D Deficiency, Fractures)   Vitamin B12   Insulin , random   Encounter for annual physical exam - Primary   CPE completed today. Review of HM activities and recommendations discussed and provided on AVS. Anticipatory guidance, diet, and exercise recommendations provided. Medications, allergies, and hx reviewed and updated as necessary. Orders placed as listed below.  Plan: - Labs ordered. Will make changes as necessary based on results.  - I will review these results and send recommendations via MyChart  or a telephone call.  - F/U with CPE in 1 year or sooner for acute/chronic health needs as directed.        Subacute cough   Chronic cough persisting for three months, likely post-viral. Worse in the morning and evening. No significant sputum production. Lungs clear on examination. If continues, consider CXR for further evaluation and monitoring. Labs pending.      Other Visit Diagnoses       Family history of thyroid  disease in mother       Relevant Orders   TSH + free T4   Hemoglobin A1c   CBC with Differential/Platelet   Comprehensive metabolic panel with GFR   Iron, TIBC and Ferritin Panel   VITAMIN D  25 Hydroxy (Vit-D Deficiency, Fractures)   Vitamin B12   Insulin , random     Family history of diabetes mellitus       Relevant Orders   Hemoglobin A1c   CBC with Differential/Platelet   Comprehensive metabolic panel with GFR   Iron, TIBC and Ferritin Panel   Insulin , random        Return in about 1 year (around 02/24/2025) for CPE.      Darlyn Repsher, Meghan BRAVO, NP, DNP, AGNP-C [facility]  Time: 59 minutes, >50% spent counseling, care coordination, chart review, and documentation.

## 2024-02-25 NOTE — Patient Instructions (Addendum)
 It was great to meet you today!   I will look over your labs once they are all back. I will be in touch with you to let you know what we find and any recommendations based on this.   We will plan to discuss medications once we have the results.   WEIGHT LOSS PLANNING  For best management of weight, it is vital to balance intake versus output. This means the number of calories burned per day must be less than the calories you take in with food and drink.   I recommend trying to follow a diet with the following: Calories: 1200-1500 calories per day Carbohydrates: 150-180 grams of carbohydrates per day  Why: Gives your body enough quick fuel for cells to maintain normal function without sending them into starvation mode.  Protein: At least 90 grams of protein per day- 30 grams with each meal Why: Protein takes longer and uses more energy than carbohydrates to break down for fuel. The carbohydrates in your meals serves as quick energy sources and proteins help use some of that extra quick energy to break down to produce long term energy. This helps you not feel hungry as quickly and protein breakdown burns calories.  Water: Drink AT LEAST 64 ounces of water per day  Why: Water is essential to healthy metabolism. Water helps to fill the stomach and keep you fuller longer. Water is required for healthy digestion and filtering of waste in the body.  Fat: Limit fats in your diet- when choosing fats, choose foods with lower fats content such as lean meats (chicken, fish, [place]).  Why: Increased fat intake leads to storage for later. Once you burn your carbohydrate energy, your body goes into fat and protein breakdown mode to help you loose weight.  Cholesterol: Fats and oils that are LIQUID at room temperature are best. Choose vegetable oils (olive oil, avocado oil, nuts). Avoid fats that are SOLID at room temperature (animal fats, processed meats). Healthy fats are often found in whole grains,  beans, nuts, seeds, and berries.  Why: Elevated cholesterol levels lead to build up of cholesterol on the inside of your blood vessels. This will eventually cause the blood vessels to become hard and can lead to high blood pressure and damage to your organs. When the blood flow is reduced, but the pressure is high from cholesterol buildup, parts of the cholesterol can break off and form clots that can go to the brain or heart leading to a stroke or heart attack.  Fiber: Increase amount of SOLUBLE the fiber in your diet. This helps to fill you up, lowers cholesterol, and helps with digestion. Some foods high in soluble fiber are oats, peas, beans, apples, carrots, barley, and citrus fruits.   Why: Fiber fills you up, helps remove excess cholesterol, and aids in healthy digestion which are all very important in weight management.   I recommend the following as a minimum activity routine: Purposeful walk or other physical activity at least 20 minutes every single day. This means purposefully taking a walk, jog, bike, swim, treadmill, elliptical, dance, etc.  This activity should be ABOVE your normal daily activities, such as walking at work. Goal exercise should be at least 150 minutes a week- work your way up to this.   Heart Rate: Your maximum exercise heart rate should be 220 - Your Age in Years. When exercising, get your heart rate up, but avoid going over the maximum targeted heart rate.  60-70% of your maximum  heart rate is where you tend to burn the most fat. To find this number:  220 - Age In Years= Max HR  Max HR x 0.6 (or 0.7) = Fat Burning HR The Fat Burning HR is your goal heart rate while working out to burn the most fat.  NEVER exercise to the point your feel lightheaded, weak, nauseated, dizzy. If you experience ANY of these symptoms- STOP exercise! Allow yourself to cool down and your heart rate to come down. Then restart slower next time.  If at ANY TIME you feel chest pain or chest  pressure during exercise, STOP IMMEDIATELY and seek medical attention.

## 2024-02-25 NOTE — Assessment & Plan Note (Signed)
 Chronic cough persisting for three months, likely post-viral. Worse in the morning and evening. No significant sputum production. Lungs clear on examination. If continues, consider CXR for further evaluation and monitoring. Labs pending.

## 2024-02-25 NOTE — Assessment & Plan Note (Signed)
 Discovered on cardiac monitor. Very short durations and infrequent. Will monitor TSH and FT4 today to r/o thyroid  etiology. Discussed today that this is a common benign finding present in most adults.  -If symptoms worsen or become frequent, please let me know. - Will monitor labs

## 2024-02-25 NOTE — Assessment & Plan Note (Signed)
 Difficulty losing weight despite diet and exercise. 7 months post partum and no longer breast feeding. Gestational hypertension history. Concerns about thyroid  function. Family history of type 1 diabetes and thyroid  issues. No current medications except multivitamin. - Order labs: TSH, T4, CBC for anemia, B12, vitamin D , and insulin  levels. - Discuss potential use of phentermine for weight loss after reviewing lab results. Will need close monitoring for blood pressure and heart rate given history.  - Consider GLP-1 if diabetes is noted.

## 2024-02-26 ENCOUNTER — Telehealth: Payer: Self-pay | Admitting: Nurse Practitioner

## 2024-02-26 ENCOUNTER — Ambulatory Visit: Payer: Self-pay | Admitting: Nurse Practitioner

## 2024-02-26 LAB — COMPREHENSIVE METABOLIC PANEL WITH GFR
ALT: 21 IU/L (ref 0–32)
AST: 19 IU/L (ref 0–40)
Albumin: 4.8 g/dL (ref 3.9–4.9)
Alkaline Phosphatase: 110 IU/L (ref 44–121)
BUN/Creatinine Ratio: 20 (ref 9–23)
BUN: 15 mg/dL (ref 6–20)
Bilirubin Total: 0.2 mg/dL (ref 0.0–1.2)
CO2: 20 mmol/L (ref 20–29)
Calcium: 9.9 mg/dL (ref 8.7–10.2)
Chloride: 101 mmol/L (ref 96–106)
Creatinine, Ser: 0.75 mg/dL (ref 0.57–1.00)
Globulin, Total: 3 g/dL (ref 1.5–4.5)
Glucose: 72 mg/dL (ref 70–99)
Potassium: 4.3 mmol/L (ref 3.5–5.2)
Sodium: 140 mmol/L (ref 134–144)
Total Protein: 7.8 g/dL (ref 6.0–8.5)
eGFR: 106 mL/min/1.73 (ref >59)

## 2024-02-26 LAB — TSH+FREE T4
Free T4: 0.99 ng/dL (ref 0.82–1.77)
TSH: 1.3 u[IU]/mL (ref 0.450–4.500)

## 2024-02-26 LAB — CBC WITH DIFFERENTIAL/PLATELET
Basophils Absolute: 0 x10E3/uL (ref 0.0–0.2)
Basos: 0 %
EOS (ABSOLUTE): 0.1 x10E3/uL (ref 0.0–0.4)
Eos: 1 %
Hematocrit: 44.9 % (ref 34.0–46.6)
Hemoglobin: 14.8 g/dL (ref 11.1–15.9)
Immature Grans (Abs): 0 x10E3/uL (ref 0.0–0.1)
Immature Granulocytes: 0 %
Lymphocytes Absolute: 3 x10E3/uL (ref 0.7–3.1)
Lymphs: 33 %
MCH: 28.7 pg (ref 26.6–33.0)
MCHC: 33 g/dL (ref 31.5–35.7)
MCV: 87 fL (ref 79–97)
Monocytes Absolute: 0.5 x10E3/uL (ref 0.1–0.9)
Monocytes: 6 %
Neutrophils Absolute: 5.6 x10E3/uL (ref 1.4–7.0)
Neutrophils: 60 %
Platelets: 286 x10E3/uL (ref 150–450)
RBC: 5.15 x10E6/uL (ref 3.77–5.28)
RDW: 13.2 % (ref 11.7–15.4)
WBC: 9.3 x10E3/uL (ref 3.4–10.8)

## 2024-02-26 LAB — IRON,TIBC AND FERRITIN PANEL
Ferritin: 48 ng/mL (ref 15–150)
Iron Saturation: 13 % — ABNORMAL LOW (ref 15–55)
Iron: 47 ug/dL (ref 27–159)
Total Iron Binding Capacity: 367 ug/dL (ref 250–450)
UIBC: 320 ug/dL (ref 131–425)

## 2024-02-26 LAB — HEMOGLOBIN A1C
Est. average glucose Bld gHb Est-mCnc: 123 mg/dL
Hgb A1c MFr Bld: 5.9 % — ABNORMAL HIGH (ref 4.8–5.6)

## 2024-02-26 LAB — INSULIN, RANDOM: INSULIN: 21.8 u[IU]/mL (ref 2.6–24.9)

## 2024-02-26 LAB — VITAMIN D 25 HYDROXY (VIT D DEFICIENCY, FRACTURES): Vit D, 25-Hydroxy: 37.9 ng/mL (ref 30.0–100.0)

## 2024-02-26 LAB — VITAMIN B12: Vitamin B-12: 693 pg/mL (ref 232–1245)

## 2024-02-26 NOTE — Telephone Encounter (Signed)
 Copied from CRM [id number]. Topic: General - Other >> Feb 26, 2024  9:43 AM Jasmin G wrote: Reason for CRM: Benefit Advocate from [employer] called to let clinic know that pt requested weightloss management meds but her Insurance doesn't cover any of them, due to this pt would like a call back to know if there are any other alternatives that would be covered by her Insurance ([employer]). Call her back at [phone].

## 2024-03-03 ENCOUNTER — Other Ambulatory Visit: Payer: Self-pay | Admitting: Nurse Practitioner

## 2024-03-03 ENCOUNTER — Telehealth: Payer: Self-pay

## 2024-03-03 DIAGNOSIS — E66812 Obesity, class 2: Secondary | ICD-10-CM

## 2024-03-03 MED ORDER — METFORMIN HCL ER 500 MG PO TB24: ORAL_TABLET | ORAL | 1 refills | Status: DC

## 2024-03-03 NOTE — Telephone Encounter (Signed)
 Pt. Agreed to Metformin .   Copied from CRM #[id number]. Topic: Clinical - Medication Question >> Mar 03, 2024  2:21 PM Yolanda T wrote: Reason for CRM: patient was calling to f/u on the mychart message she sent on 8/15: Please f/u with patient.  Thank you for the information. After speaking with my husband we think that trying the metformin  may be the best thing to try. With helping blood sugar and weight I like the idea of trying to know out two birds with one stone. I didn't realize until my husband said something yesterday how much sugar things I had started eating or how many times I said I wanted something sweet.

## 2024-03-03 NOTE — Telephone Encounter (Signed)
 Please let Meghan Mosley know I have sent metformin  in for her. She can start with 1 pill with dinner. After two weeks, she can increase to 2 pills with dinner.   I recommend taking it in the middle of the meal or after she has eaten, as this seems to help reduce side effects.

## 2024-03-09 ENCOUNTER — Ambulatory Visit: Payer: Self-pay | Admitting: Nurse Practitioner

## 2024-05-16 ENCOUNTER — Other Ambulatory Visit: Payer: Self-pay | Admitting: Nurse Practitioner

## 2024-05-16 DIAGNOSIS — E6609 Other obesity due to excess calories: Secondary | ICD-10-CM

## 2024-06-26 ENCOUNTER — Ambulatory Visit: Admitting: Internal Medicine

## 2024-06-26 ENCOUNTER — Ambulatory Visit: Admitting: Nurse Practitioner

## 2024-06-26 ENCOUNTER — Encounter: Payer: Self-pay | Admitting: Nurse Practitioner

## 2024-06-26 VITALS — BP 128/82 | HR 64 | Wt 200.8 lb

## 2024-06-26 DIAGNOSIS — B9689 Other specified bacterial agents as the cause of diseases classified elsewhere: Secondary | ICD-10-CM

## 2024-06-26 DIAGNOSIS — J069 Acute upper respiratory infection, unspecified: Secondary | ICD-10-CM

## 2024-06-26 DIAGNOSIS — H66002 Acute suppurative otitis media without spontaneous rupture of ear drum, left ear: Secondary | ICD-10-CM

## 2024-06-26 DIAGNOSIS — R059 Cough, unspecified: Secondary | ICD-10-CM

## 2024-06-26 HISTORY — DX: Acute suppurative otitis media without spontaneous rupture of ear drum, left ear: H66.002

## 2024-06-26 LAB — POC COVID19 BINAXNOW: SARS Coronavirus 2 Ag: NEGATIVE

## 2024-06-26 LAB — POCT INFLUENZA A/B
Influenza A, POC: NEGATIVE
Influenza B, POC: NEGATIVE

## 2024-06-26 MED ORDER — AZITHROMYCIN 250 MG PO TABS: ORAL_TABLET | ORAL | 0 refills | Status: AC

## 2024-06-26 MED ORDER — PREDNISONE 20 MG PO TABS: 20.0000 mg | ORAL_TABLET | Freq: Every day | ORAL | 0 refills | Status: DC

## 2024-06-26 NOTE — Progress Notes (Unsigned)
{  SEHM (Optional):34217:p}  Meghan FORBES Doing, DNP, AGNP-c [facility] [address] [place], [place] [address] [phone]   ACUTE VISIT : Acute or New Concern Visit on 06/26/2024  Blood pressure 128/82, pulse 64, weight 200 lb 12.8 oz (91.1 kg), not currently breastfeeding.   Subjective:  other (Congestion, cough, ear pain lt. Ear, started Sunday, no testing but kids tested for flu and covid and it was all negative, ST from coughing, losing voice, )   *** ROS negative except for what is listed in HPI. History, Medications, Surgery, SDOH, and Family History reviewed and updated as appropriate.  Objective:  Physical Exam Vitals and nursing note reviewed.  Constitutional:      Appearance: Normal appearance.  HENT:     Head: Normocephalic.     Right Ear: A middle ear effusion is present.     Left Ear: Tenderness present. A middle ear effusion is present. Tympanic membrane is erythematous and bulging.  Eyes:     Pupils: Pupils are equal, round, and reactive to light.  Cardiovascular:     Rate and Rhythm: Normal rate and regular rhythm.     Pulses: Normal pulses.     Heart sounds: Normal heart sounds.  Pulmonary:     Effort: Pulmonary effort is normal.     Breath sounds: Normal breath sounds.  Musculoskeletal:     Cervical back: Normal range of motion. Tenderness present.  Lymphadenopathy:     Cervical: Cervical adenopathy present.  Skin:    General: Skin is warm.  Neurological:     General: No focal deficit present.     Mental Status: She is alert and oriented to person, place, and time.  Psychiatric:        Mood and Affect: Mood normal.         Assessment & Plan:   Assessment & Plan Non-recurrent acute suppurative otitis media of left ear without spontaneous rupture of tympanic membrane  Orders:   azithromycin (ZITHROMAX) 250 MG tablet; Take 2 tablets on day 1, then 1 tablet daily on days 2 through 5   predniSONE  (DELTASONE ) 20 MG tablet; Take 1  tablet (20 mg total) by mouth daily with breakfast.  Bacterial URI  Orders:   azithromycin (ZITHROMAX) 250 MG tablet; Take 2 tablets on day 1, then 1 tablet daily on days 2 through 5   predniSONE  (DELTASONE ) 20 MG tablet; Take 1 tablet (20 mg total) by mouth daily with breakfast.  Cough, unspecified type  Orders:   Influenza A/B   POC COVID-19    Meghan FORBES Doing, DNP, AGNP-c  {SETIMEYorN (Optional):34216}

## 2024-06-26 NOTE — Patient Instructions (Signed)

## 2024-06-26 NOTE — Assessment & Plan Note (Signed)
°  Orders:   azithromycin (ZITHROMAX) 250 MG tablet; Take 2 tablets on day 1, then 1 tablet daily on days 2 through 5   predniSONE  (DELTASONE ) 20 MG tablet; Take 1 tablet (20 mg total) by mouth daily with breakfast.

## 2024-06-29 ENCOUNTER — Encounter: Payer: Self-pay | Admitting: Nurse Practitioner

## 2024-07-06 ENCOUNTER — Encounter: Payer: Self-pay | Admitting: Nurse Practitioner

## 2024-07-23 ENCOUNTER — Ambulatory Visit (INDEPENDENT_AMBULATORY_CARE_PROVIDER_SITE_OTHER): Admitting: Nurse Practitioner

## 2024-07-23 ENCOUNTER — Encounter: Payer: Self-pay | Admitting: Nurse Practitioner

## 2024-07-23 ENCOUNTER — Other Ambulatory Visit: Payer: Self-pay | Admitting: Nurse Practitioner

## 2024-07-23 VITALS — BP 134/84 | HR 85 | Ht 63.5 in | Wt 198.6 lb

## 2024-07-23 DIAGNOSIS — Z1329 Encounter for screening for other suspected endocrine disorder: Secondary | ICD-10-CM

## 2024-07-23 DIAGNOSIS — Z8349 Family history of other endocrine, nutritional and metabolic diseases: Secondary | ICD-10-CM | POA: Diagnosis not present

## 2024-07-23 DIAGNOSIS — Z13228 Encounter for screening for other metabolic disorders: Secondary | ICD-10-CM | POA: Diagnosis not present

## 2024-07-23 DIAGNOSIS — Z6836 Body mass index (BMI) 36.0-36.9, adult: Secondary | ICD-10-CM | POA: Diagnosis not present

## 2024-07-23 DIAGNOSIS — Z1321 Encounter for screening for nutritional disorder: Secondary | ICD-10-CM | POA: Diagnosis not present

## 2024-07-23 DIAGNOSIS — Z0000 Encounter for general adult medical examination without abnormal findings: Secondary | ICD-10-CM | POA: Diagnosis not present

## 2024-07-23 DIAGNOSIS — E66812 Obesity, class 2: Secondary | ICD-10-CM | POA: Diagnosis not present

## 2024-07-23 DIAGNOSIS — E6609 Other obesity due to excess calories: Secondary | ICD-10-CM

## 2024-07-23 DIAGNOSIS — R7303 Prediabetes: Secondary | ICD-10-CM

## 2024-07-23 DIAGNOSIS — Z130 Encounter for screening for diseases of the blood and blood-forming organs and certain disorders involving the immune mechanism: Secondary | ICD-10-CM | POA: Diagnosis not present

## 2024-07-23 MED ORDER — ZEPBOUND 2.5 MG/0.5ML ~~LOC~~ SOAJ: 2.5000 mg | SUBCUTANEOUS | 0 refills | Status: AC

## 2024-07-23 NOTE — Assessment & Plan Note (Addendum)
 Persistent weight fluctuations despite efforts with metformin  and lifestyle modifications. Strong cravings for sweets, possibly related to insulin  resistance. Concerns about crossing the diabetic threshold. - Will obtain labs today and consider additional treatment options.  - Continue lifestyle modifications including tracking weight, increasing physical activity, and mindful eating. - OK to try fish oil and magnesium  supplementation to help with stress and cravings. Orders:   Comprehensive metabolic panel with GFR   Lipid panel   Insulin , random   Hemoglobin A1c   TSH

## 2024-07-23 NOTE — Assessment & Plan Note (Addendum)
 CPE completed today. Review of HM activities and recommendations discussed and provided on AVS. Anticipatory guidance, diet, and exercise recommendations provided. Medications, allergies, and hx reviewed and updated as necessary. Orders placed as listed below as part of routine health maintenance and monitoring.   Plan: - Labs ordered. Will make changes as necessary based on results.  - I will review these results and send recommendations via MyChart or a telephone call.  - F/U with CPE in 1 year or sooner for acute/chronic health needs as directed.   Orders:   Comprehensive metabolic panel with GFR   Lipid panel   Insulin , random   Hemoglobin A1c   TSH

## 2024-07-23 NOTE — Progress Notes (Signed)
 "  Meghan Doing, DNP, AGNP-c [facility] [address] [place], [place] [address] Main Office [phone] VISIT TYPE: CPE on 07/23/2024 Today's Vitals   07/23/24 1454  BP: 134/84  Pulse: 85  Weight: 198 lb 9.6 oz (90.1 kg)  Height: 5' 3.5 (1.613 m)   Body mass index is 34.63 kg/m.  Wt Readings from Last 3 Encounters:  07/23/24 198 lb 9.6 oz (90.1 kg)  06/26/24 200 lb 12.8 oz (91.1 kg)  02/25/24 210 lb (95.3 kg)     Subjective:  Annual Exam (CPE, not fasting, )  History of Present Illness Meghan Mosley is a 39 year old who presents for her annual physical exam.   She is actively working on [employer], with her weight fluctuating between 197 and 200 pounds despite efforts to maintain a healthy diet and exercise routine. She experiences strong cravings for sweets, which she describes as almost addictive. She has been on metformin  for prediabetes and is actively trying to prevent crossing the diabetic threshold, given her family history of diabetes. She is incorporating more physical activity into her routine, including morning workouts, yoga, and brisk walks, and is exploring nutritional supplements like fish oil and magnesium  to help manage stress and cravings.  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  Pertinent items are noted in HPI.     07/23/2024    2:54 PM 02/25/2024    2:02 PM 01/07/2024    9:55 AM 10/09/2023    9:28 AM 06/19/2023   10:53 AM  Depression screen PHQ 2/9  Decreased Interest 0 0 0 0 0  Down, Depressed, Hopeless 0 0 0 0 0  PHQ - 2 Score 0 0 0 0 0  Altered sleeping   0    Tired, decreased energy   2    Change in appetite   1    Feeling bad or failure about yourself    0    Trouble concentrating   1    Moving slowly or fidgety/restless   0    Suicidal thoughts   0    PHQ-9 Score   4     Difficult Mos[employer] work/chores   Not difficult at all       Data saved with a previous  flowsheet row definition       04/23/2022    2:18 PM 01/10/2022   11:03 AM 01/05/2020    3:25 PM  GAD 7 : Generalized Anxiety Score  Nervous, Anxious, on Edge 1 0 2  Control/stop worrying 2 0 2  Worry too much - different things 1 0 1  Trouble relaxing 1 0 1  Restless 0 0 2  Easily annoyed or irritable 1 0 1  Afraid - awful might happen 1 0 3  Total GAD 7 Score 7 0 12  Anxiety Difficulty Somewhat difficult Not difficult at all Somewhat difficult       07/23/2024    2:54 PM 02/25/2024    2:02 PM 01/07/2024    9:55 AM 10/09/2023    9:28 AM 06/19/2023   10:53 AM  Fall Risk   Falls in the past year? 0 0 0 0 0  Number falls in past yr: 0 0 0 0 0  Injury with Fall? 0 0  0  0  0   Risk for fall due to : No Fall Risks No Fall Risks No Fall Risks No Fall Risks No Fall Risks  Follow up  Falls evaluation completed Falls evaluation completed Falls prevention discussed Falls prevention discussed Falls prevention discussed     Data saved with a previous flowsheet row definition   Past medical history, surgical history, medications, allergies, family history and social history reviewed with patient today and changes made to appropriate areas of the chart.      Objective:    Physical Exam Vitals and nursing note reviewed.  Constitutional:      General: She is not in acute distress.    Appearance: Normal appearance. She is not ill-appearing.  HENT:     Head: Normocephalic and atraumatic.     Right Ear: Hearing, tympanic membrane, ear canal and external ear normal.     Left Ear: Hearing, tympanic membrane, ear canal and external ear normal.     Nose: Nose normal.     Right Sinus: No maxillary sinus tenderness or frontal sinus tenderness.     Left Sinus: No maxillary sinus tenderness or frontal sinus tenderness.     Mouth/Throat:     Lips: Pink.     Mouth: Mucous membranes are moist.     Pharynx: Oropharynx is clear.  Eyes:     General: Lids are normal. Vision grossly intact.      Extraocular Movements: Extraocular movements intact.     Conjunctiva/sclera: Conjunctivae normal.     Pupils: Pupils are equal, round, and reactive to light.     Funduscopic exam:    Right eye: Red reflex present.        Left eye: Red reflex present.    Visual Fields: Right eye visual fields normal and left eye visual fields normal.  Neck:     Thyroid : No thyromegaly.     Vascular: No carotid bruit.  Cardiovascular:     Rate and Rhythm: Normal rate and regular rhythm.     Chest Wall: PMI is not displaced.     Pulses: Normal pulses.          Dorsalis pedis pulses are 2+ on the right side and 2+ on the left side.       Posterior tibial pulses are 2+ on the right side and 2+ on the left side.     Heart sounds: Normal heart sounds. No murmur heard. Pulmonary:     Effort: Pulmonary effort is normal. No respiratory distress.     Breath sounds: Normal breath sounds.  Abdominal:     General: Abdomen is flat. Bowel sounds are normal. There is no distension.     Palpations: Abdomen is soft. There is no hepatomegaly, splenomegaly or mass.     Tenderness: There is no abdominal tenderness. There is no right CVA tenderness, left CVA tenderness, guarding or rebound.  Musculoskeletal:        General: Normal range of motion.     Cervical back: Full passive range of motion without pain, normal range of motion and neck supple. No tenderness.     Right lower leg: No edema.     Left lower leg: No edema.  Feet:     Left foot:     Toenail Condition: Left toenails are normal.  Lymphadenopathy:     Cervical: No cervical adenopathy.     Upper Body:     Right upper body: No supraclavicular adenopathy.     Left upper body: No supraclavicular adenopathy.  Skin:    General: Skin is warm and dry.     Capillary Refill: Capillary refill takes less than 2 seconds.  Nails: There is no clubbing.  Neurological:     General: No focal deficit present.     Mental Status: She is alert and oriented to person,  place, and time.     GCS: GCS eye subscore is 4. GCS verbal subscore is 5. GCS motor subscore is 6.     Sensory: Sensation is intact.     Motor: Motor function is intact.     Coordination: Coordination is intact.     Gait: Gait is intact.     Deep Tendon Reflexes: Reflexes are normal and symmetric.  Psychiatric:        Attention and Perception: Attention normal.        Mood and Affect: Mood normal.        Speech: Speech normal.        Behavior: Behavior normal. Behavior is cooperative.        Thought Content: Thought content normal.        Cognition and Memory: Cognition and memory normal.        Judgment: Judgment normal.         Assessment & Plan:   Assessment & Plan Encounter for annual physical exam CPE completed today. Review of HM activities and recommendations discussed and provided on AVS. Anticipatory guidance, diet, and exercise recommendations provided. Medications, allergies, and hx reviewed and updated as necessary. Orders placed as listed below as part of routine health maintenance and monitoring.   Plan: - Labs ordered. Will make changes as necessary based on results.  - I will review these results and send recommendations via MyChart or a telephone call.  - F/U with CPE in 1 year or sooner for acute/chronic health needs as directed.   Orders:   Comprehensive metabolic panel with GFR   Lipid panel   Insulin , random   Hemoglobin A1c   TSH  Class 2 obesity due to excess calories without serious comorbidity with body mass index (BMI) of 36.0 to 36.9 in adult Persistent weight fluctuations despite efforts with metformin  and lifestyle modifications. Strong cravings for sweets, possibly related to insulin  resistance. Concerns about crossing the diabetic threshold. - Will obtain labs today and consider additional treatment options.  - Continue lifestyle modifications including tracking weight, increasing physical activity, and mindful eating. - OK to try fish oil and  magnesium  supplementation to help with stress and cravings. Orders:   Comprehensive metabolic panel with GFR   Lipid panel   Insulin , random   Hemoglobin A1c   TSH  Prediabetes Concerns about crossing the diabetic threshold. Family history of diabetes. Strong cravings for sweets, possibly related to insulin  resistance. Emphasis on lifestyle modifications to prevent progression to diabetes. - Ordered insulin  A1c and TSH to monitor glucose levels and thyroid  function. - Continue lifestyle modifications including tracking weight, increasing physical activity, and mindful eating. Orders:   Insulin , random   Hemoglobin A1c   TSH  Family history of thyroid  disease in mother  Orders:   Insulin , random   Hemoglobin A1c   TSH  NEXT PREVENTATIVE PHYSICAL DUE IN 1 YEAR.    PATIENT COUNSELING PROVIDED FOR ALL ADULT PATIENTS: A well balanced diet low in saturated fats, cholesterol, and moderation in carbohydrates.  This can be as simple as monitoring portion sizes and cutting back on sugary beverages such as soda and juice to start with.    Daily water consumption of at least 64 ounces.  Physical activity at least 180 minutes per week.  If just starting out, start 10 minutes  a day and work your way up.   This can be as simple as taking the stairs instead of the elevator and walking 2-3 laps around the office  purposefully every day.   STD protection, partner selection, and regular testing if high risk.  Limited consumption of alcoholic beverages if alcohol is consumed. For men, I recommend no more than 14 alcoholic beverages per week, spread out throughout the week (max 2 per day). Avoid binge drinking or consuming large quantities of alcohol in one setting.  Please let me know if you feel you may need help with reduction or quitting alcohol consumption.   Avoidance of nicotine, if used. Please let me know if you feel you may need help with reduction or quitting nicotine use.    Daily mental health attention. This can be in the form of 5 minute daily meditation, prayer, journaling, yoga, reflection, etc.  Purposeful attention to your emotions and mental state can significantly improve your overall wellbeing  and Health.  Please know that I am here to help you with all of your health care goals and am happy to work with you to find a solution that works best for you.  The greatest advice I have received with any changes in life are to take it one step at a time, that even means if all you can focus on is the next 60 seconds, then do that and celebrate your victories.  With any changes in life, you will have set backs, and that is OK. The important thing to remember is, if you have a set back, it is not a failure, it is an opportunity to try again! Screening Testing Mammogram Every 1 -2 years based on history and risk factors Starting at age 15 Pap Smear Ages 21-39 every 3 years Ages 51-65 every 5 years with HPV testing More frequent testing may be required based on results and history Colon Cancer Screening Every 1-10 years based on test performed, risk factors, and history Starting at age 21 Bone Density Screening Every 2-10 years based on history Starting at age 49 for women Recommendations for men differ based on medication usage, history, and risk factors AAA Screening One time ultrasound Men 82-17 years old who have every smoked Lung Cancer Screening Low Dose Lung CT every 12 months Age 47-80 years with a 30 pack-year smoking history who still smoke or who have quit within the last 15 years   Screening Labs Routine  Labs: Complete Blood Count (CBC), Complete Metabolic Panel (CMP), Cholesterol (Lipid Panel) Every 6-12 months based on history and medications May be recommended more frequently based on current conditions or previous results Hemoglobin A1c Lab Every 3-12 months based on history and previous results Starting at age 17 or earlier with  diagnosis of diabetes, high cholesterol, BMI >26, and/or risk factors Frequent monitoring for patients with diabetes to ensure blood sugar control Thyroid  Panel (TSH) Every 6 months based on history, symptoms, and risk factors May be repeated more often if on medication HIV One time testing for all patients 38 and older May be repeated more frequently for patients with increased risk factors or exposure Hepatitis C One time testing for all patients 21 and older May be repeated more frequently for patients with increased risk factors or exposure Gonorrhea, Chlamydia Every 12 months for all sexually active persons 13-24 years Additional monitoring may be recommended for those who are considered high risk or who have symptoms Every 12 months for any woman on birth  control, regardless of sexual activity PSA Men 59-71 years old with risk factors Additional screening may be recommended from age 8-69 based on risk factors, symptoms, and history  Vaccine Recommendations Tetanus Booster All adults every 10 years Flu Vaccine All patients 6 months and older every year COVID Vaccine All patients 12 years and older Initial dosing with booster May recommend additional booster based on age and health history HPV Vaccine 2 doses all patients age 4-26 Dosing may be considered for patients over 26 Shingles Vaccine (Shingrix) 2 doses all adults 55 years and older Pneumonia (Pneumovax 8) All adults 65 years and older May recommend earlier dosing based on health history One year apart from Prevnar 13 Pneumonia (Prevnar 66) All adults 65 years and older Dosed 1 year after Pneumovax 23 Pneumonia (Prevnar 20) One time alternative to the two dosing of 13 and 23 For all adults with initial dose of 23, 20 is recommended 1 year later For all adults with initial dose of 13, 23 is still recommended as second option 1 year later        "

## 2024-07-23 NOTE — Patient Instructions (Signed)
WEIGHT LOSS PLANNING  For best management of weight, it is vital to balance intake versus output. This means the number of calories burned per day must be less than the calories you take in with food and drink.   I recommend trying to follow a diet with the following: Calories: 1200-1500 calories per day Carbohydrates: 150-180 grams of carbohydrates per day  Why: Gives your body enough "quick fuel" for cells to maintain normal function without sending them into starvation mode.  Protein: At least 90 grams of protein per day- 30 grams with each meal Why: Protein takes longer and uses more energy than carbohydrates to break down for fuel. The carbohydrates in your meals serves as quick energy sources and proteins help use some of that extra quick energy to break down to produce long term energy. This helps you not feel hungry as quickly and protein breakdown burns calories.  Water: Drink AT LEAST 64 ounces of water per day  Why: Water is essential to healthy metabolism. Water helps to fill the stomach and keep you fuller longer. Water is required for healthy digestion and filtering of waste in the body.  Fat: Limit fats in your diet- when choosing fats, choose foods with lower fats content such as lean meats (chicken, fish, turkey).  Why: Increased fat intake leads to storage "for later". Once you burn your carbohydrate energy, your body goes into fat and protein breakdown mode to help you loose weight.  Cholesterol: Fats and oils that are LIQUID at room temperature are best. Choose vegetable oils (olive oil, avocado oil, nuts). Avoid fats that are SOLID at room temperature (animal fats, processed meats). Healthy fats are often found in whole grains, beans, nuts, seeds, and berries.  Why: Elevated cholesterol levels lead to build up of cholesterol on the inside of your blood vessels. This will eventually cause the blood vessels to become hard and can lead to high blood pressure and damage to your  organs. When the blood flow is reduced, but the pressure is high from cholesterol buildup, parts of the cholesterol can break off and form clots that can go to the brain or heart leading to a stroke or heart attack.  Fiber: Increase amount of SOLUBLE the fiber in your diet. This helps to fill you up, lowers cholesterol, and helps with digestion. Some foods high in soluble fiber are oats, peas, beans, apples, carrots, barley, and citrus fruits.   Why: Fiber fills you up, helps remove excess cholesterol, and aids in healthy digestion which are all very important in weight management.   I recommend the following as a minimum activity routine: Purposeful walk or other physical activity at least 20 minutes every single day. This means purposefully taking a walk, jog, bike, swim, treadmill, elliptical, dance, etc.  This activity should be ABOVE your normal daily activities, such as walking at work. Goal exercise should be at least 150 minutes a week- work your way up to this.   Heart Rate: Your maximum exercise heart rate should be 220 - Your Age in Years. When exercising, get your heart rate up, but avoid going over the maximum targeted heart rate.  60-70% of your maximum heart rate is where you tend to burn the most fat. To find this number:  220 - Age In Years= Max HR  Max HR x 0.6 (or 0.7) = Fat Burning HR The Fat Burning HR is your goal heart rate while working out to burn the most fat.  NEVER exercise to   the point your feel lightheaded, weak, nauseated, dizzy. If you experience ANY of these symptoms- STOP exercise! Allow yourself to cool down and your heart rate to come down. Then restart slower next time.  If at ANY TIME you feel chest pain or chest pressure during exercise, STOP IMMEDIATELY and seek medical attention.   

## 2024-07-28 LAB — COMPREHENSIVE METABOLIC PANEL WITH GFR
ALT: 15 IU/L (ref 0–32)
AST: 17 IU/L (ref 0–40)
Albumin: 4.5 g/dL (ref 3.9–4.9)
Alkaline Phosphatase: 82 IU/L (ref 41–116)
BUN/Creatinine Ratio: 12 (ref 9–23)
BUN: 10 mg/dL (ref 6–20)
Bilirubin Total: 0.4 mg/dL (ref 0.0–1.2)
CO2: 21 mmol/L (ref 20–29)
Calcium: 9.7 mg/dL (ref 8.7–10.2)
Chloride: 106 mmol/L (ref 96–106)
Creatinine, Ser: 0.82 mg/dL (ref 0.57–1.00)
Globulin, Total: 2.7 g/dL (ref 1.5–4.5)
Glucose: 81 mg/dL (ref 70–99)
Potassium: 4.4 mmol/L (ref 3.5–5.2)
Sodium: 140 mmol/L (ref 134–144)
Total Protein: 7.2 g/dL (ref 6.0–8.5)
eGFR: 95 mL/min/1.73

## 2024-07-28 LAB — LIPID PANEL
Chol/HDL Ratio: 4.6 ratio — ABNORMAL HIGH (ref 0.0–4.4)
Cholesterol, Total: 201 mg/dL — ABNORMAL HIGH (ref 100–199)
HDL: 44 mg/dL
LDL Chol Calc (NIH): 136 mg/dL — ABNORMAL HIGH (ref 0–99)
Triglycerides: 118 mg/dL (ref 0–149)
VLDL Cholesterol Cal: 21 mg/dL (ref 5–40)

## 2024-07-28 LAB — TSH: TSH: 0.904 u[IU]/mL (ref 0.450–4.500)

## 2024-07-28 LAB — HEMOGLOBIN A1C
Est. average glucose Bld gHb Est-mCnc: 105 mg/dL
Hgb A1c MFr Bld: 5.3 % (ref 4.8–5.6)

## 2024-07-28 LAB — INSULIN, RANDOM: INSULIN: 10.4 u[IU]/mL (ref 2.6–24.9)

## 2024-07-29 ENCOUNTER — Ambulatory Visit: Payer: Self-pay | Admitting: Nurse Practitioner

## 2024-08-12 ENCOUNTER — Other Ambulatory Visit (HOSPITAL_COMMUNITY): Payer: Self-pay

## 2025-08-02 ENCOUNTER — Encounter: Admitting: Nurse Practitioner
